# Patient Record
Sex: Male | Born: 1985 | Race: White | Hispanic: No | Marital: Single | Smoking: Never smoker
Health system: Southern US, Community
[De-identification: ages and names within clinical notes are randomized; demographics above are authoritative.]

## PROBLEM LIST (undated history)

## (undated) DIAGNOSIS — F32A Depression, unspecified: Secondary | ICD-10-CM

## (undated) DIAGNOSIS — F319 Bipolar disorder, unspecified: Secondary | ICD-10-CM

## (undated) DIAGNOSIS — F419 Anxiety disorder, unspecified: Secondary | ICD-10-CM

## (undated) DIAGNOSIS — K760 Fatty (change of) liver, not elsewhere classified: Secondary | ICD-10-CM

## (undated) DIAGNOSIS — F329 Major depressive disorder, single episode, unspecified: Secondary | ICD-10-CM

## (undated) DIAGNOSIS — F209 Schizophrenia, unspecified: Secondary | ICD-10-CM

---

## 2013-10-18 ENCOUNTER — Emergency Department (HOSPITAL_COMMUNITY)
Admission: EM | Admit: 2013-10-18 | Discharge: 2013-10-19 | Disposition: A | Discharge status: Home / Self Care | Payer: Medicare Other | Attending: Emergency Medicine | Admitting: Emergency Medicine

## 2013-10-18 ENCOUNTER — Encounter (HOSPITAL_COMMUNITY): Payer: Self-pay | Admitting: Emergency Medicine

## 2013-10-18 DIAGNOSIS — Z8719 Personal history of other diseases of the digestive system: Secondary | ICD-10-CM | POA: Insufficient documentation

## 2013-10-18 DIAGNOSIS — F329 Major depressive disorder, single episode, unspecified: Secondary | ICD-10-CM | POA: Insufficient documentation

## 2013-10-18 DIAGNOSIS — F3289 Other specified depressive episodes: Secondary | ICD-10-CM | POA: Insufficient documentation

## 2013-10-18 DIAGNOSIS — Z046 Encounter for general psychiatric examination, requested by authority: Secondary | ICD-10-CM | POA: Insufficient documentation

## 2013-10-18 DIAGNOSIS — R45851 Suicidal ideations: Secondary | ICD-10-CM | POA: Insufficient documentation

## 2013-10-18 DIAGNOSIS — F4325 Adjustment disorder with mixed disturbance of emotions and conduct: Secondary | ICD-10-CM | POA: Diagnosis present

## 2013-10-18 HISTORY — DX: Schizophrenia, unspecified: F20.9

## 2013-10-18 HISTORY — DX: Fatty (change of) liver, not elsewhere classified: K76.0

## 2013-10-18 LAB — CBC
HCT: 41.6 % (ref 39.0–52.0)
Hemoglobin: 14.6 g/dL (ref 13.0–17.0)
MCH: 31.6 pg (ref 26.0–34.0)
MCHC: 35.1 g/dL (ref 30.0–36.0)
MCV: 90 fL (ref 78.0–100.0)
Platelets: 291 10*3/uL (ref 150–400)
RBC: 4.62 MIL/uL (ref 4.22–5.81)
RDW: 12.6 % (ref 11.5–15.5)
WBC: 10.2 10*3/uL (ref 4.0–10.5)

## 2013-10-18 LAB — COMPREHENSIVE METABOLIC PANEL
ALT: 72 U/L — ABNORMAL HIGH (ref 0–53)
ANION GAP: 16 — AB (ref 5–15)
AST: 39 U/L — ABNORMAL HIGH (ref 0–37)
Albumin: 4.2 g/dL (ref 3.5–5.2)
Alkaline Phosphatase: 83 U/L (ref 39–117)
BILIRUBIN TOTAL: 0.7 mg/dL (ref 0.3–1.2)
BUN: 9 mg/dL (ref 6–23)
CHLORIDE: 103 meq/L (ref 96–112)
CO2: 24 meq/L (ref 19–32)
Calcium: 9.9 mg/dL (ref 8.4–10.5)
Creatinine, Ser: 0.7 mg/dL (ref 0.50–1.35)
GFR calc Af Amer: >90 mL/min (ref >90)
Glucose, Bld: 102 mg/dL — ABNORMAL HIGH (ref 70–99)
Potassium: 3.3 mEq/L — ABNORMAL LOW (ref 3.7–5.3)
Sodium: 143 mEq/L (ref 137–147)
Total Protein: 8.4 g/dL — ABNORMAL HIGH (ref 6.0–8.3)

## 2013-10-18 LAB — ACETAMINOPHEN LEVEL: Acetaminophen (Tylenol), Serum: <15 ug/mL (ref 10–30)

## 2013-10-18 LAB — SALICYLATE LEVEL: Salicylate Lvl: <2 mg/dL — ABNORMAL LOW (ref 2.8–20.0)

## 2013-10-18 LAB — VALPROIC ACID LEVEL: VALPROIC ACID LVL: 24.1 ug/mL — AB (ref 50.0–100.0)

## 2013-10-18 LAB — ETHANOL: Alcohol, Ethyl (B): <11 mg/dL (ref 0–11)

## 2013-10-18 MED ORDER — ACETAMINOPHEN 325 MG PO TABS: 650.0000 mg | ORAL_TABLET | ORAL | Status: DC | PRN

## 2013-10-18 MED ORDER — ZOLPIDEM TARTRATE 5 MG PO TABS
5.0000 mg | ORAL_TABLET | Freq: Every evening | ORAL | Status: DC | PRN
  Administered 2013-10-18: 5 mg via ORAL
  Filled 2013-10-18: qty 1

## 2013-10-18 MED ORDER — SERTRALINE HCL 50 MG PO TABS
50.0000 mg | ORAL_TABLET | Freq: Two times a day (BID) | ORAL | Status: DC
  Administered 2013-10-18 – 2013-10-19 (×2): 50 mg via ORAL
  Filled 2013-10-18 (×2): qty 1

## 2013-10-18 MED ORDER — HYDROXYZINE HCL 25 MG PO TABS: 25.0000 mg | ORAL_TABLET | Freq: Every day | ORAL | Status: DC

## 2013-10-18 MED ORDER — DIVALPROEX SODIUM ER 500 MG PO TB24
750.0000 mg | ORAL_TABLET | Freq: Every day | ORAL | Status: DC
  Administered 2013-10-18: 750 mg via ORAL
  Filled 2013-10-18 (×2): qty 1

## 2013-10-18 MED ORDER — OMEGA-3-ACID ETHYL ESTERS 1 G PO CAPS
1.0000 g | ORAL_CAPSULE | Freq: Two times a day (BID) | ORAL | Status: DC
  Administered 2013-10-18 – 2013-10-19 (×2): 1 g via ORAL
  Filled 2013-10-18 (×3): qty 1

## 2013-10-18 MED ORDER — ONDANSETRON HCL 4 MG PO TABS: 4.0000 mg | ORAL_TABLET | Freq: Three times a day (TID) | ORAL | Status: DC | PRN

## 2013-10-18 MED ORDER — POTASSIUM CHLORIDE CRYS ER 20 MEQ PO TBCR
20.0000 meq | EXTENDED_RELEASE_TABLET | Freq: Once | ORAL | Status: AC
  Administered 2013-10-18: 20 meq via ORAL
  Filled 2013-10-18: qty 1

## 2013-10-18 MED ORDER — LURASIDONE HCL 80 MG PO TABS
140.0000 mg | ORAL_TABLET | Freq: Every day | ORAL | Status: DC
  Administered 2013-10-19: 140 mg via ORAL
  Filled 2013-10-18 (×2): qty 2

## 2013-10-18 MED ORDER — PANTOPRAZOLE SODIUM 40 MG PO TBEC
40.0000 mg | DELAYED_RELEASE_TABLET | Freq: Every day | ORAL | Status: DC
  Administered 2013-10-18 – 2013-10-19 (×2): 40 mg via ORAL
  Filled 2013-10-18 (×2): qty 1

## 2013-10-18 MED ORDER — OLANZAPINE 10 MG PO TABS
20.0000 mg | ORAL_TABLET | Freq: Every day | ORAL | Status: DC
  Administered 2013-10-18: 20 mg via ORAL
  Filled 2013-10-18: qty 2

## 2013-10-18 MED ORDER — ALUM & MAG HYDROXIDE-SIMETH 200-200-20 MG/5ML PO SUSP: 30.0000 mL | ORAL | Status: DC | PRN

## 2013-10-18 MED ORDER — IBUPROFEN 200 MG PO TABS: 600.0000 mg | ORAL_TABLET | Freq: Three times a day (TID) | ORAL | Status: DC | PRN

## 2013-10-18 NOTE — ED Notes (Signed)
Pt BIB [facility] because of "anger issues".  Pt states that he lives in a group home and is depressed and has thoughts of hurting himself.  Pt denies any specific plan and denies HI.  Pt is voluntary and cooperative.
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
## 2013-10-19 ENCOUNTER — Encounter (HOSPITAL_COMMUNITY): Payer: Self-pay | Admitting: Psychiatry

## 2013-10-19 DIAGNOSIS — F4325 Adjustment disorder with mixed disturbance of emotions and conduct: Secondary | ICD-10-CM | POA: Diagnosis present

## 2013-10-19 LAB — RAPID URINE DRUG SCREEN, HOSP PERFORMED
Amphetamines: NOT DETECTED
BARBITURATES: NOT DETECTED
Benzodiazepines: NOT DETECTED
Cocaine: NOT DETECTED
Opiates: NOT DETECTED
Tetrahydrocannabinol: NOT DETECTED

## 2013-10-19 MED ORDER — LISDEXAMFETAMINE DIMESYLATE 20 MG PO CAPS
20.0000 mg | ORAL_CAPSULE | Freq: Every day | ORAL | Status: DC
  Administered 2013-10-19: 20 mg via ORAL
  Filled 2013-10-19: qty 1

## 2013-10-19 MED ORDER — DOCUSATE SODIUM 100 MG PO CAPS: 100.0000 mg | ORAL_CAPSULE | Freq: Two times a day (BID) | ORAL | Status: DC

## 2013-10-19 MED ORDER — DIVALPROEX SODIUM ER 250 MG PO TB24: 750.0000 mg | ORAL_TABLET | Freq: Every day | ORAL | Status: DC

## 2013-10-19 MED ORDER — OMEPRAZOLE 20 MG PO CPDR: 20.0000 mg | DELAYED_RELEASE_CAPSULE | ORAL | Status: DC

## 2013-10-19 MED ORDER — LISDEXAMFETAMINE DIMESYLATE 20 MG PO CAPS: 20.0000 mg | ORAL_CAPSULE | Freq: Every day | ORAL | Status: DC

## 2013-10-19 MED ORDER — SERTRALINE HCL 50 MG PO TABS: 50.0000 mg | ORAL_TABLET | Freq: Two times a day (BID) | ORAL | Status: DC

## 2013-10-19 MED ORDER — OLANZAPINE 20 MG PO TABS: 20.0000 mg | ORAL_TABLET | Freq: Every day | ORAL | Status: DC

## 2013-10-19 MED ORDER — LURASIDONE HCL 20 MG PO TABS: 140.0000 mg | ORAL_TABLET | Freq: Every day | ORAL | Status: DC

## 2013-10-19 MED ORDER — HYDROXYZINE HCL 25 MG PO TABS: 25.0000 mg | ORAL_TABLET | Freq: Every day | ORAL | Status: DC

## 2013-10-19 MED ORDER — OMEGA-3-ACID ETHYL ESTERS 1 G PO CAPS: 1.0000 g | ORAL_CAPSULE | Freq: Two times a day (BID) | ORAL | Status: DC
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
## 2013-10-19 NOTE — ED Provider Notes (Signed)
Medical screening examination/treatment/procedure(s) were performed by non-physician practitioner and as supervising physician I was immediately available for consultation/collaboration.   EKG Interpretation None        Gerhard Munchobert Elza Sortor, MD 10/19/13 [id number]
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
## 2013-10-19 NOTE — BH Assessment (Signed)
B[facility] Assessment Progress Note      Pt has been psychiatrically cleared for discharge by Dr Kumar.  Notified [name], group home director [phone], who will pick the pt up today.  

## 2013-10-19 NOTE — Progress Notes (Signed)
  CARE MANAGEMENT ED NOTE 10/19/2013  Patient:  [name] [name]   Account Number:  [id number]  Date Initiated:  10/19/2013  Documentation initiated by:  [name] [name],[name] [name]  Subjective/Objective Assessment:   28 [facility] who [place] c/o himself Scientist, forensiceports [facility] new psychologist changed meds (Vyvanse+) [name]     Subjective/Objective Assessment Detail:   DX Major Depressive Disorder, recurrent, severe. PMH fatty liver, schizophrenia  Axis II: Mental retardation, severity unknown No pcp listed Pt states he does not know the name of the pcp [place] Gave permission for CM to ask Group home staff  [facility] group home director [phone], [name] that the pcp is Dr [name] [name]     Action/Plan:   ED CM spoke with pt and [name] about pcp services EPIC updated   Action/Plan Detail:   Anticipated DC Date:  10/19/2013     Status Recommendation to Physician:   Result of Recommendation:    Other ED Services  Consult Working Plan    DC Planning Services  Other  PCP issues  Outpatient Services - Pt will follow up    Choice offered to / List presented to:            Status of service:  Completed, signed off  ED Comments:   ED Comments Detail:

## 2013-10-19 NOTE — ED Notes (Signed)
Patient discharged to group home.  All belongings returned.  Patient was able to verbalize a plan to talk issues out with a worker that he trusts when he is feeling upset.  He stated that sometimes he has racing thoughts, especially at night.  He was started back on Vyvance and was given the first dose.  All care discussed with group home worker.  Prescriptions and paperwork handed to her.  Patient left the unit ambulatory.

## 2013-10-19 NOTE — B[facility] Suicide Risk Assessment (Signed)
Suicide Risk Assessment  Discharge Assessment     Demographic Factors:  Male and Caucasian  Total Time spent with patient: 20 minutes  Psychiatric Specialty Exam:     Blood pressure 101/55, pulse 88, temperature 98 F (36.7 C), temperature source Oral, resp. rate 16, SpO2 100.00%.There is no height or weight on file to calculate BMI.  General Appearance: Casual  Eye Contact::  Good  Speech:  Normal Rate  Volume:  Normal  Mood:  Euthymic  Affect:  Congruent  Thought Process:  Coherent  Orientation:  Full (Time, Place, and Person)  Thought Content:  WDL  Suicidal Thoughts:  No  Homicidal Thoughts:  No  Memory:  Immediate;   Good Recent;   Good Remote;   Good  Judgement:  Fair  Insight:  Fair  Psychomotor Activity:  Normal  Concentration:  Good  Recall:  Good  Fund of Knowledge:Fair  Language: Good  Akathisia:  No  Handed:  Right  AIMS (if indicated):     Assets:  Health and safety inspectorinancial Resources/Insurance Housing Leisure Time Physical Health Resilience Social Support Transportation  Sleep:      Musculoskeletal: Strength & Muscle Tone: within normal limits Gait & Station: normal Patient leans: N/A  Mental Status Per Nursing Assessment::   On Admission:   Upset with group home  Current Mental Status by Physician: NA  Loss Factors: NA  Historical Factors: NA  Risk Reduction Factors:   Sense of responsibility to family, Living with another person, especially a relative, Positive social support and Positive therapeutic relationship  Continued Clinical Symptoms:  None  Cognitive Features That Contribute To Risk:  None  Suicide Risk:  Minimal: No identifiable suicidal ideation.  Patients presenting with no risk factors but with morbid ruminations; may be classified as minimal risk based on the severity of the depressive symptoms  Discharge Diagnoses:   AXIS I:  Adjustment Disorder with Mixed Disturbance of Emotions and Conduct AXIS II:  Deferred AXIS III:    Past Medical History  Diagnosis Date  . Fatty liver   . Schizophrenia    AXIS IV:  other psychosocial or environmental problems AXIS V:  61-70 mild symptoms  Plan Of Care/Follow-up recommendations:  Activity:  as tolerated Diet:  low-sodium heart healthy diet  Is patient on multiple antipsychotic therapies at discharge:  No   Has Patient had three or more failed trials of antipsychotic monotherapy by history:  No  Recommended Plan for Multiple Antipsychotic Therapies: NA    Deniece Rankin, PMH-NP 10/19/2013, 9:48 AM

## 2013-11-23 ENCOUNTER — Emergency Department (HOSPITAL_COMMUNITY)
Admission: EM | Admit: 2013-11-23 | Discharge: 2013-11-24 | Disposition: A | Discharge status: Home / Self Care | Payer: Medicare Other | Source: Home / Self Care | Attending: Emergency Medicine | Admitting: Emergency Medicine

## 2013-11-23 ENCOUNTER — Encounter (HOSPITAL_COMMUNITY): Payer: Self-pay | Admitting: Emergency Medicine

## 2013-11-23 DIAGNOSIS — Z8719 Personal history of other diseases of the digestive system: Secondary | ICD-10-CM

## 2013-11-23 DIAGNOSIS — R197 Diarrhea, unspecified: Secondary | ICD-10-CM | POA: Insufficient documentation

## 2013-11-23 DIAGNOSIS — Z79899 Other long term (current) drug therapy: Secondary | ICD-10-CM | POA: Insufficient documentation

## 2013-11-23 DIAGNOSIS — F209 Schizophrenia, unspecified: Secondary | ICD-10-CM | POA: Insufficient documentation

## 2013-11-23 DIAGNOSIS — E876 Hypokalemia: Secondary | ICD-10-CM | POA: Insufficient documentation

## 2013-11-23 DIAGNOSIS — F458 Other somatoform disorders: Secondary | ICD-10-CM | POA: Insufficient documentation

## 2013-11-23 DIAGNOSIS — R112 Nausea with vomiting, unspecified: Secondary | ICD-10-CM | POA: Insufficient documentation

## 2013-11-23 DIAGNOSIS — R1110 Vomiting, unspecified: Secondary | ICD-10-CM | POA: Insufficient documentation

## 2013-11-23 LAB — CBC WITH DIFFERENTIAL/PLATELET
BASOS ABS: 0 10*3/uL (ref 0.0–0.1)
Basophils Relative: 1 % (ref 0–1)
EOS ABS: 0.1 10*3/uL (ref 0.0–0.7)
Eosinophils Relative: 1 % (ref 0–5)
HCT: 41.8 % (ref 39.0–52.0)
Hemoglobin: 15.1 g/dL (ref 13.0–17.0)
LYMPHS PCT: 37 % (ref 12–46)
Lymphs Abs: 2.3 10*3/uL (ref 0.7–4.0)
MCH: 31.8 pg (ref 26.0–34.0)
MCHC: 36.1 g/dL — ABNORMAL HIGH (ref 30.0–36.0)
MCV: 88 fL (ref 78.0–100.0)
Monocytes Absolute: 0.8 10*3/uL (ref 0.1–1.0)
Monocytes Relative: 12 % (ref 3–12)
NEUTROS PCT: 49 % (ref 43–77)
Neutro Abs: 3.2 10*3/uL (ref 1.7–7.7)
PLATELETS: 308 10*3/uL (ref 150–400)
RBC: 4.75 MIL/uL (ref 4.22–5.81)
RDW: 12.7 % (ref 11.5–15.5)
WBC: 6.3 10*3/uL (ref 4.0–10.5)

## 2013-11-23 LAB — COMPREHENSIVE METABOLIC PANEL
ALT: 68 U/L — AB (ref 0–53)
AST: 34 U/L (ref 0–37)
Albumin: 4.2 g/dL (ref 3.5–5.2)
Alkaline Phosphatase: 72 U/L (ref 39–117)
Anion gap: 17 — ABNORMAL HIGH (ref 5–15)
BUN: <3 mg/dL — ABNORMAL LOW (ref 6–23)
CO2: 24 mEq/L (ref 19–32)
Calcium: 9.7 mg/dL (ref 8.4–10.5)
Chloride: 100 mEq/L (ref 96–112)
Creatinine, Ser: 0.5 mg/dL (ref 0.50–1.35)
GFR calc Af Amer: >90 mL/min (ref >90)
GFR calc non Af Amer: >90 mL/min (ref >90)
Glucose, Bld: 103 mg/dL — ABNORMAL HIGH (ref 70–99)
Potassium: 3.4 mEq/L — ABNORMAL LOW (ref 3.7–5.3)
SODIUM: 141 meq/L (ref 137–147)
TOTAL PROTEIN: 7.8 g/dL (ref 6.0–8.3)
Total Bilirubin: 0.7 mg/dL (ref 0.3–1.2)

## 2013-11-23 LAB — URINALYSIS, ROUTINE W REFLEX MICROSCOPIC
Bilirubin Urine: NEGATIVE
GLUCOSE, UA: NEGATIVE mg/dL
HGB URINE DIPSTICK: NEGATIVE
Ketones, ur: NEGATIVE mg/dL
Leukocytes, UA: NEGATIVE
Nitrite: NEGATIVE
PH: 7 (ref 5.0–8.0)
Protein, ur: NEGATIVE mg/dL
SPECIFIC GRAVITY, URINE: 1.004 — AB (ref 1.005–1.030)
Urobilinogen, UA: 1 mg/dL (ref 0.0–1.0)

## 2013-11-23 LAB — TSH: TSH: 2.93 u[IU]/mL (ref 0.350–4.500)

## 2013-11-23 LAB — VALPROIC ACID LEVEL: VALPROIC ACID LVL: 34.1 ug/mL — AB (ref 50.0–100.0)

## 2013-11-23 NOTE — ED Notes (Addendum)
Pt sent here from [facility]  Pt is under IVC and has Product/process development scientist with him  Pt has been vomiting today and has had diarrhea x 1 month  He resides in a group home and was seen at [facility] approximately 2 to 3 weeks ago  Reported vomiting and diarrhea then, group home advised to follow up with MD or ED and never did  Pt continues to have same sxs  Reports 22 lb weight loss in 1 month and has poor appetite  Sent here for medical clearance and if ok may go back to [facility] requesting several labs including stool culture and testing for c-diff

## 2013-11-24 ENCOUNTER — Encounter (HOSPITAL_COMMUNITY): Payer: Self-pay | Admitting: Emergency Medicine

## 2013-11-24 ENCOUNTER — Emergency Department (HOSPITAL_COMMUNITY)
Admission: EM | Admit: 2013-11-24 | Discharge: 2013-11-27 | Disposition: A | Discharge status: Home / Self Care | Payer: Medicare Other | Attending: Dermatology | Admitting: Dermatology

## 2013-11-24 ENCOUNTER — Emergency Department (HOSPITAL_COMMUNITY): Payer: Medicare Other

## 2013-11-24 DIAGNOSIS — F4325 Adjustment disorder with mixed disturbance of emotions and conduct: Secondary | ICD-10-CM | POA: Diagnosis present

## 2013-11-24 DIAGNOSIS — F458 Other somatoform disorders: Secondary | ICD-10-CM

## 2013-11-24 DIAGNOSIS — F209 Schizophrenia, unspecified: Secondary | ICD-10-CM

## 2013-11-24 DIAGNOSIS — R4689 Other symptoms and signs involving appearance and behavior: Secondary | ICD-10-CM

## 2013-11-24 LAB — COMPREHENSIVE METABOLIC PANEL
ALT: 73 U/L — ABNORMAL HIGH (ref 0–53)
AST: 39 U/L — AB (ref 0–37)
Albumin: 4.1 g/dL (ref 3.5–5.2)
Alkaline Phosphatase: 66 U/L (ref 39–117)
Anion gap: 14 (ref 5–15)
BILIRUBIN TOTAL: 0.6 mg/dL (ref 0.3–1.2)
BUN: 5 mg/dL — ABNORMAL LOW (ref 6–23)
CO2: 25 mEq/L (ref 19–32)
CREATININE: 0.58 mg/dL (ref 0.50–1.35)
Calcium: 9.9 mg/dL (ref 8.4–10.5)
Chloride: 102 mEq/L (ref 96–112)
GFR calc Af Amer: >90 mL/min (ref >90)
GFR calc non Af Amer: >90 mL/min (ref >90)
Glucose, Bld: 104 mg/dL — ABNORMAL HIGH (ref 70–99)
POTASSIUM: 3.8 meq/L (ref 3.7–5.3)
Sodium: 141 mEq/L (ref 137–147)
Total Protein: 7.6 g/dL (ref 6.0–8.3)

## 2013-11-24 LAB — URINALYSIS, ROUTINE W REFLEX MICROSCOPIC
Glucose, UA: NEGATIVE mg/dL
Hgb urine dipstick: NEGATIVE
KETONES UR: 15 mg/dL — AB
Leukocytes, UA: NEGATIVE
Nitrite: NEGATIVE
Protein, ur: NEGATIVE mg/dL
Specific Gravity, Urine: 1.025 (ref 1.005–1.030)
Urobilinogen, UA: 2 mg/dL — ABNORMAL HIGH (ref 0.0–1.0)
pH: 7 (ref 5.0–8.0)

## 2013-11-24 MED ORDER — LURASIDONE HCL 40 MG PO TABS
40.0000 mg | ORAL_TABLET | Freq: Every day | ORAL | Status: DC
  Administered 2013-11-24 – 2013-11-26 (×3): 40 mg via ORAL
  Filled 2013-11-24 (×5): qty 1

## 2013-11-24 MED ORDER — HYDROXYZINE HCL 25 MG PO TABS
25.0000 mg | ORAL_TABLET | Freq: Every day | ORAL | Status: DC
  Administered 2013-11-24 – 2013-11-26 (×3): 25 mg via ORAL
  Filled 2013-11-24 (×3): qty 1

## 2013-11-24 MED ORDER — OLANZAPINE 10 MG PO TABS
20.0000 mg | ORAL_TABLET | Freq: Every day | ORAL | Status: DC
  Administered 2013-11-24 – 2013-11-26 (×3): 20 mg via ORAL
  Filled 2013-11-24 (×3): qty 2

## 2013-11-24 MED ORDER — SERTRALINE HCL 50 MG PO TABS
50.0000 mg | ORAL_TABLET | Freq: Every day | ORAL | Status: DC
  Filled 2013-11-24: qty 1

## 2013-11-24 MED ORDER — ONDANSETRON 4 MG PO TBDP: ORAL_TABLET | ORAL | Status: DC

## 2013-11-24 MED ORDER — POTASSIUM CHLORIDE CRYS ER 20 MEQ PO TBCR
40.0000 meq | EXTENDED_RELEASE_TABLET | Freq: Once | ORAL | Status: AC
Start: 2013-11-24 — End: 2013-11-24
  Administered 2013-11-24: 40 meq via ORAL
  Filled 2013-11-24: qty 2

## 2013-11-24 MED ORDER — DIVALPROEX SODIUM ER 500 MG PO TB24
500.0000 mg | ORAL_TABLET | Freq: Every day | ORAL | Status: DC
  Administered 2013-11-24 – 2013-11-27 (×4): 500 mg via ORAL
  Filled 2013-11-24 (×4): qty 1

## 2013-11-24 MED ORDER — METOCLOPRAMIDE HCL 5 MG/ML IJ SOLN
10.0000 mg | Freq: Once | INTRAMUSCULAR | Status: AC
  Administered 2013-11-24: 10 mg via INTRAMUSCULAR
  Filled 2013-11-24: qty 2

## 2013-11-24 MED ORDER — PANTOPRAZOLE SODIUM 40 MG PO TBEC
40.0000 mg | DELAYED_RELEASE_TABLET | Freq: Every day | ORAL | Status: DC
  Administered 2013-11-24 – 2013-11-27 (×4): 40 mg via ORAL
  Filled 2013-11-24 (×4): qty 1

## 2013-11-24 NOTE — ED Notes (Signed)
Receiving nurse in [facility] to call back for report.

## 2013-11-24 NOTE — Discharge Instructions (Signed)
You were unable to have a bowel movement in the ER today and thus we were unable to test it for infection. You were given a sample container to take home, use it to collect a sample with your next bowel movement and [facility] can send it for culture and studies to rule out infection.   Diarrhea Diarrhea is frequent loose and watery bowel movements. It can cause you to feel weak and dehydrated. Dehydration can cause you to become tired and thirsty, have a dry mouth, and have decreased urination that often is dark yellow. Diarrhea is a sign of another problem, most often an infection that will not last long. In most cases, diarrhea typically lasts 2-3 days. However, it can last longer if it is a sign of something more serious. It is important to treat your diarrhea as directed by your caregiver to lessen or prevent future episodes of diarrhea. CAUSES  Some common causes include:  Gastrointestinal infections caused by viruses, bacteria, or parasites.  Food poisoning or food allergies.  Certain medicines, such as antibiotics, chemotherapy, and laxatives.  Artificial sweeteners and fructose.  Digestive disorders. HOME CARE INSTRUCTIONS  Ensure adequate fluid intake (hydration): Have 1 cup (8 oz) of fluid for each diarrhea episode. Avoid fluids that contain simple sugars or sports drinks, fruit juices, whole milk products, and sodas. Your urine should be clear or pale yellow if you are drinking enough fluids. Hydrate with an oral rehydration solution that you can purchase at pharmacies, retail stores, and online. You can prepare an oral rehydration solution at home by mixing the following ingredients together:   - tsp table salt.   tsp baking soda.   tsp salt substitute containing potassium chloride.  1  tablespoons sugar.  1 L (34 oz) of water.  Certain foods and beverages may increase the speed at which food moves through the gastrointestinal (GI) tract. These foods and beverages should be  avoided and include:  Caffeinated and alcoholic beverages.  High-fiber foods, such as raw fruits and vegetables, nuts, seeds, and whole grain breads and cereals.  Foods and beverages sweetened with sugar alcohols, such as xylitol, sorbitol, and mannitol.  Some foods may be well tolerated and may help thicken stool including:  Starchy foods, such as rice, toast, pasta, low-sugar cereal, oatmeal, grits, baked potatoes, crackers, and bagels.  Bananas.  Applesauce.  Add probiotic-rich foods to help increase healthy bacteria in the GI tract, such as yogurt and fermented milk products.  Wash your hands well after each diarrhea episode.  Only take over-the-counter or prescription medicines as directed by your caregiver.  Take a warm bath to relieve any burning or pain from frequent diarrhea episodes. SEEK IMMEDIATE MEDICAL CARE IF:   You are unable to keep fluids down.  You have persistent vomiting.  You have blood in your stool, or your stools are black and tarry.  You do not urinate in 6-8 hours, or there is only a small amount of very dark urine.  You have abdominal pain that increases or localizes.  You have weakness, dizziness, confusion, or light-headedness.  You have a severe headache.  Your diarrhea gets worse or does not get better.  You have a fever or persistent symptoms for more than 2-3 days.  You have a fever and your symptoms suddenly get worse. MAKE SURE YOU:   Understand these instructions.  Will watch your condition.  Will get help right away if you are not doing well or get worse. Document Released: 03/02/2002 Document  Revised: 07/27/2013 Document Reviewed: 11/18/2011 [facility] Patient Information 2015 [place], [place]. This information is not intended to replace advice given to you by your health care provider. Make sure you discuss any questions you have with your health care provider.    Hypokalemia Hypokalemia means that the amount of potassium  in the blood is lower than normal.Potassium is a chemical, called an electrolyte, that helps regulate the amount of fluid in the body. It also stimulates muscle contraction and helps nerves function properly.Most of the body's potassium is inside of cells, and only a very small amount is in the blood. Because the amount in the blood is so small, minor changes can be life-threatening. CAUSES  Antibiotics.  Diarrhea or vomiting.  Using laxatives too much, which can cause diarrhea.  Chronic kidney disease.  Water pills (diuretics).  Eating disorders (bulimia).  Low magnesium level.  Sweating a lot. SIGNS AND SYMPTOMS  Weakness.  Constipation.  Fatigue.  Muscle cramps.  Mental confusion.  Skipped heartbeats or irregular heartbeat (palpitations).  Tingling or numbness. DIAGNOSIS  Your health care provider can diagnose hypokalemia with blood tests. In addition to checking your potassium level, your health care provider may also check other lab tests. TREATMENT Hypokalemia can be treated with potassium supplements taken by mouth or adjustments in your current medicines. If your potassium level is very low, you may need to get potassium through a vein (IV) and be monitored in the hospital. A diet high in potassium is also helpful. Foods high in potassium are:  Nuts, such as peanuts and pistachios.  Seeds, such as sunflower seeds and pumpkin seeds.  Peas, lentils, and lima beans.  Whole grain and bran cereals and breads.  Fresh fruit and vegetables, such as apricots, avocado, bananas, cantaloupe, kiwi, oranges, tomatoes, asparagus, and potatoes.  Orange and tomato juices.  Red meats.  Fruit yogurt. HOME CARE INSTRUCTIONS  Take all medicines as prescribed by your health care provider.  Maintain a healthy diet by including nutritious food, such as fruits, vegetables, nuts, whole grains, and lean meats.  If you are taking a laxative, be sure to follow the directions  on the label. SEEK MEDICAL CARE IF:  Your weakness gets worse.  You feel your heart pounding or racing.  You are vomiting or having diarrhea.  You are diabetic and having trouble keeping your blood glucose in the normal range. SEEK IMMEDIATE MEDICAL CARE IF:  You have chest pain, shortness of breath, or dizziness.  You are vomiting or having diarrhea for more than 2 days.  You faint. MAKE SURE YOU:   Understand these instructions.  Will watch your condition.  Will get help right away if you are not doing well or get worse. Document Released: 03/12/2005 Document Revised: 12/31/2012 Document Reviewed: 09/12/2012 [facility] Patient Information 2015 [place], [place]. This information is not intended to replace advice given to you by your health care provider. Make sure you discuss any questions you have with your health care provider.

## 2013-11-24 NOTE — ED Notes (Signed)
Patient transported to X-ray 

## 2013-11-24 NOTE — ED Notes (Signed)
EDP notified of conversation with Marcelle Smiling at [facility]. Natasha informed Clinical research associate that [facility] could not legally accept patient back at their facility because patient was vomiting. Marcelle Smiling informed that the patient was cleared medically by Dr. Denton Lank. Marcelle Smiling stated that the patient had been discharged by their physician and could not accept him back.

## 2013-11-24 NOTE — ED Notes (Signed)
Patient belongings are in locker 45

## 2013-11-24 NOTE — ED Provider Notes (Addendum)
CSN: [id number]     Arrival date & time 11/24/13  1534 History   First MD Initiated Contact with Patient 11/24/13 1649     Chief Complaint  Patient presents with  . Emesis     (Consider location/radiation/quality/duration/timing/severity/associated sxs/prior Treatment) Patient is a 28 y.o. male presenting with vomiting. The history is provided by the patient.  Emesis Associated symptoms: no abdominal pain, no chills, no diarrhea, no headaches and no sore throat   pt w hx schizophrenia, ivc'd re psychosis, from [place], as pt w nv.  Pt reports 1-2 episodes non bloody, non bilious emesis. No abd pain or distension. Had normal bm today. No fever or chills. No dysuria. Normal appetite. unspec wt loss. Pt denies recent abx use or new med use. Was in ED w same yesterday.  Parent here, notes long hx same, denies specific medical dx, ?psychogenic vs physiologic. No prior abd surgery.    Past Medical History  Diagnosis Date  . Fatty liver   . Schizophrenia    History reviewed. No pertinent past surgical history. History reviewed. No pertinent family history. History  Substance Use Topics  . Smoking status: Never Smoker   . Smokeless tobacco: Never Used  . Alcohol Use: No    Review of Systems  Constitutional: Negative for fever and chills.  HENT: Negative for sore throat.   Eyes: Negative for redness.  Respiratory: Negative for cough and shortness of breath.   Cardiovascular: Negative for chest pain and leg swelling.  Gastrointestinal: Positive for vomiting. Negative for abdominal pain and diarrhea.  Genitourinary: Negative for flank pain.  Musculoskeletal: Negative for back pain and neck pain.  Skin: Negative for rash.  Neurological: Negative for syncope and headaches.  Hematological: Does not bruise/bleed easily.  Psychiatric/Behavioral: Negative for confusion.      Allergies  Review of patient's allergies indicates no known allergies.  Home Medications   Prior to Admission  medications   Medication Sig Start Date End Date Taking? Authorizing Provider  divalproex (DEPAKOTE ER) 500 MG 24 hr tablet Take 500 mg by mouth daily.   Yes Historical Provider, MD  docusate sodium (COLACE) 100 MG capsule Take 200 mg by mouth 2 (two) times daily.   Yes Historical Provider, MD  folic acid (FOLVITE) 1 MG tablet Take 1 mg by mouth daily.   Yes Historical Provider, MD  hydrOXYzine (ATARAX/VISTARIL) 25 MG tablet Take 1 tablet (25 mg total) by mouth at bedtime. 10/19/13  Yes Nanine Means, NP  lurasidone (LATUDA) 40 MG TABS tablet Take 40 mg by mouth at bedtime.   Yes Historical Provider, MD  OLANZapine (ZYPREXA) 20 MG tablet Take 1 tablet (20 mg total) by mouth at bedtime. 10/19/13  Yes Nanine Means, NP  omega-3 acid ethyl esters (LOVAZA) 1 G capsule Take 1 capsule (1 g total) by mouth 2 (two) times daily. 10/19/13  Yes Nanine Means, NP  omeprazole (PRILOSEC) 20 MG capsule Take 1 capsule (20 mg total) by mouth every morning. 10/19/13  Yes Nanine Means, NP  ondansetron (ZOFRAN-ODT) 4 MG disintegrating tablet Take 4 mg by mouth every 4 (four) hours as needed for nausea or vomiting. 11/24/13  Yes Audree Camel, MD  sertraline (ZOLOFT) 50 MG tablet Take 1 tablet (50 mg total) by mouth 2 (two) times daily. 10/19/13  Yes Nanine Means, NP   BP 123/71  Pulse 78  Temp(Src) 98.2 F (36.8 C) (Oral)  Resp 16  SpO2 95% Physical Exam  Nursing note and vitals reviewed. Constitutional: He is  oriented to person, place, and time. He appears well-developed and well-nourished. No distress.  HENT:  Head: Atraumatic.  Mouth/Throat: Oropharynx is clear and moist.  Eyes: Conjunctivae are normal. Pupils are equal, round, and reactive to light. No scleral icterus.  Neck: Neck supple. No tracheal deviation present.  No stiffness or rigidity  Cardiovascular: Normal rate, regular rhythm, normal heart sounds and intact distal pulses.   Pulmonary/Chest: Effort normal and breath sounds normal. No accessory  muscle usage. No respiratory distress.  Abdominal: Soft. Bowel sounds are normal. He exhibits no distension and no mass. There is no tenderness. There is no rebound and no guarding.  No hernia.   Genitourinary:  No cva tenderness  Musculoskeletal: Normal range of motion. He exhibits no edema and no tenderness.  Neurological: He is alert and oriented to person, place, and time.  Steady gait.   Skin: Skin is warm and dry. No rash noted. He is not diaphoretic.  Psychiatric: He has a normal mood and affect.    ED Course  Procedures (including critical care time) Labs Review  Results for orders placed during the hospital encounter of 11/24/13  COMPREHENSIVE METABOLIC PANEL      Result Value Ref Range   Sodium 141  137 - 147 mEq/L   Potassium 3.8  3.7 - 5.3 mEq/L   Chloride 102  96 - 112 mEq/L   CO2 25  19 - 32 mEq/L   Glucose, Bld 104 (*) 70 - 99 mg/dL   BUN 5 (*) 6 - 23 mg/dL   Creatinine, Ser 9.60  0.50 - 1.35 mg/dL   Calcium 9.9  8.4 - 45.4 mg/dL   Total Protein 7.6  6.0 - 8.3 g/dL   Albumin 4.1  3.5 - 5.2 g/dL   AST 39 (*) 0 - 37 U/L   ALT 73 (*) 0 - 53 U/L   Alkaline Phosphatase 66  39 - 117 U/L   Total Bilirubin 0.6  0.3 - 1.2 mg/dL   GFR calc non Af Amer >90  >90 mL/min   GFR calc Af Amer >90  >90 mL/min   Anion gap 14  5 - 15   Dg Abd 1 View  11/24/2013   CLINICAL DATA:  Abdominal pain and vomiting for one month with weight loss  EXAM: ABDOMEN - 1 VIEW  COMPARISON:  None.  FINDINGS: The bowel gas pattern is within the limits of normal. There are no abnormal soft tissue calcifications. There 2 phleboliths within the left aspect of the pelvis. The bony structures are unremarkable. The visualized portions of the lung bases are clear.  IMPRESSION: There is no acute intra-abdominal or pelvic abnormality demonstrated on this two-view series.   Electronically Signed   By: David  [place]   On: 11/24/2013 17:47     MDM  Po fluids/meal.  Reviewed nursing notes and prior charts for  additional history.   Albumin normal, bun normal, k normal. No indication wasting or malnourishment on exam. No emesis during ED stay. Completely benign abd exam. abd films neg ua from past day also w no signs dehydration. .  ?whether episodes emesis psychogenic.  Pt came from [name] where he has been w ivc papers awaiting further psych tx/placement.  Pt appears medically stable for return to [facility].   Will contact law enforcement/security for transport back to [facility], as pt has been there since Sunday and they have ivc'd pt.   [facility] contacted, they are refusing to accept pt back.  Psych team consulted for  evaluation and placement.  dispo per psych team.      Suzi Roots, MD 11/24/13 [phone]
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
## 2013-11-24 NOTE — Discharge Instructions (Signed)
Pt is medically clear for return to [facility] - return to [facility] via Nurse, learning disability, as Mr Buffone has been a patient there the past 2 days, and they have ivc'd him.
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
## 2013-11-24 NOTE — ED Notes (Signed)
Patient ambulated to restroom, unable to provide sample.

## 2013-11-24 NOTE — ED Provider Notes (Signed)
CSN: [id number]     Arrival date & time 11/23/13  2005 History   First MD Initiated Contact with Patient 11/23/13 2353     Chief Complaint  Patient presents with  . Diarrhea  . Emesis     (Consider location/radiation/quality/duration/timing/severity/associated sxs/prior Treatment) HPI 28 year old male presents with diarrhea for the past 2 months. He states that this seems to increase with increased eating. He states that after most meals he seems to have diarrhea afterwards. Has loose watery stools. No blood or mucus. No fevers. Patient has also been having intermittent nausea and vomiting after meals. He's not sure which meals bring this on. Denies abdominal pain. He states that he eats less he seems to have less stools. He is currently under IVC and his facility sent him to the ED to rule out C. difficile and get a stool culture.  Past Medical History  Diagnosis Date  . Fatty liver   . Schizophrenia    History reviewed. No pertinent past surgical history. History reviewed. No pertinent family history. History  Substance Use Topics  . Smoking status: Never Smoker   . Smokeless tobacco: Never Used  . Alcohol Use: No    Review of Systems  Constitutional: Negative for fever.  Gastrointestinal: Positive for nausea, vomiting and diarrhea. Negative for abdominal pain and blood in stool.  All other systems reviewed and are negative.     Allergies  Review of patient's allergies indicates no known allergies.  Home Medications   Prior to Admission medications   Medication Sig Start Date End Date Taking? Authorizing Provider  divalproex (DEPAKOTE ER) 500 MG 24 hr tablet Take 500 mg by mouth daily.   Yes Historical Provider, MD  docusate sodium (COLACE) 100 MG capsule Take 200 mg by mouth 2 (two) times daily.   Yes Historical Provider, MD  folic acid (FOLVITE) 1 MG tablet Take 1 mg by mouth daily.   Yes Historical Provider, MD  hydrOXYzine (ATARAX/VISTARIL) 25 MG tablet Take 1  tablet (25 mg total) by mouth at bedtime. 10/19/13  Yes Nanine Means, NP  lurasidone (LATUDA) 40 MG TABS tablet Take 40 mg by mouth at bedtime.   Yes Historical Provider, MD  OLANZapine (ZYPREXA) 20 MG tablet Take 1 tablet (20 mg total) by mouth at bedtime. 10/19/13  Yes Nanine Means, NP  omega-3 acid ethyl esters (LOVAZA) 1 G capsule Take 1 capsule (1 g total) by mouth 2 (two) times daily. 10/19/13  Yes Nanine Means, NP  omeprazole (PRILOSEC) 20 MG capsule Take 1 capsule (20 mg total) by mouth every morning. 10/19/13  Yes Nanine Means, NP  sertraline (ZOLOFT) 50 MG tablet Take 1 tablet (50 mg total) by mouth 2 (two) times daily. 10/19/13  Yes Nanine Means, NP   BP 120/64  Pulse 84  Temp(Src) 98.4 F (36.9 C) (Oral)  Resp 17  SpO2 100% Physical Exam  Nursing note and vitals reviewed. Constitutional: He is oriented to person, place, and time. He appears well-developed and well-nourished. No distress.  HENT:  Head: Normocephalic and atraumatic.  Right Ear: External ear normal.  Left Ear: External ear normal.  Nose: Nose normal.  Mouth/Throat: Oropharynx is clear and moist.  Eyes: Right eye exhibits no discharge. Left eye exhibits no discharge.  Neck: Neck supple.  Cardiovascular: Normal rate.   Pulmonary/Chest: Effort normal.  Abdominal: Soft. He exhibits no distension. There is no tenderness.  Musculoskeletal: He exhibits no edema.  Neurological: He is alert and oriented to person, place, and time.  Skin: Skin is warm and dry.    ED Course  Procedures (including critical care time) Labs Review Labs Reviewed  CBC WITH DIFFERENTIAL - Abnormal; Notable for the following:    MCHC 36.1 (*)    All other components within normal limits  COMPREHENSIVE METABOLIC PANEL - Abnormal; Notable for the following:    Potassium 3.4 (*)    Glucose, Bld 103 (*)    BUN <3 (*)    ALT 68 (*)    Anion gap 17 (*)    All other components within normal limits  URINALYSIS, ROUTINE W REFLEX MICROSCOPIC -  Abnormal; Notable for the following:    Specific Gravity, Urine 1.004 (*)    All other components within normal limits  VALPROIC ACID LEVEL - Abnormal; Notable for the following:    Valproic Acid Lvl 34.1 (*)    All other components within normal limits  STOOL CULTURE  CLOSTRIDIUM DIFFICILE BY PCR  TSH    Imaging Review No results found.   EKG Interpretation None      MDM   Final diagnoses:  Diarrhea  Non-intractable vomiting with nausea, vomiting of unspecified type  Hypokalemia    Patient does not have any risk factors for more severe diarrhea such as C. difficile. His facility is requesting C. difficile to be evaluated, however after over 4 hours patient is unable to produce a stool sample. Due to this will provide a stool sample container and he can have it checked as an outpatient through his facility. He otherwise not having any pain or current vomiting. He has very mild hypokalemia which was replaced in the ED. He is well appearing, well-hydrated, and stable for discharge.    Audree Camel, MD 11/24/13 [phone]

## 2013-11-25 ENCOUNTER — Encounter (HOSPITAL_COMMUNITY): Payer: Self-pay | Admitting: Registered Nurse

## 2013-11-25 DIAGNOSIS — F4324 Adjustment disorder with disturbance of conduct: Secondary | ICD-10-CM

## 2013-11-25 DIAGNOSIS — R4689 Other symptoms and signs involving appearance and behavior: Secondary | ICD-10-CM | POA: Diagnosis present

## 2013-11-25 DIAGNOSIS — F458 Other somatoform disorders: Secondary | ICD-10-CM | POA: Diagnosis not present
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
## 2013-11-25 NOTE — Progress Notes (Signed)
CSW completed the patient's FL2 and Dr. Fredderick Phenix signed.  CSW will pass on to day shift CSW to fax to guardian in the AM to [phone].     CSW attempted to find group home placement for the patient at the following facilities:  [facility]- someone put CSW on hold never returned to the phone [facility]- no answer [facility]- only has male beds [facility]- left a message [facility]- spoke with Maryruth Hancock said no availability [facility]- spoke with Maisie Fus no availability [facility]- spoke with Misty Stanley she requested an FL2 to be faxed to [phone] for review by the owner and someone will call back.    CSW will provide the guardian with this information in the morning for consent.     Maryelizabeth Rowan, MSW, [place], 11/25/2013 Evening Clinical Social Worker [phone]

## 2013-11-25 NOTE — ED Notes (Signed)
Patient has been resting comfortably in bed. Denies SI, HI, and AV hallucinations. Calm and cooperative. Will continue to monitor. Billy Coast, RN
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
## 2013-11-25 NOTE — Progress Notes (Signed)
Clinical Social Work  CSW received a return call from guardian reporting that she had spoken to group home who reported they did not want to take patient back. Guardian reports that group home had not given her a notice and usually a 2 week notice is required. Guardian reports she is working on finding another group home placement for patient but has not had any success at this time. Guardian is aware that patient cannot stay in ED waiting on placement and reports she will talk with group home about accepting patient back since no notice was given. Guardian reports she will call CSW back once she has further information.  [place], [place] (ED Coverage [phone])
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
## 2013-11-25 NOTE — Progress Notes (Signed)
CSW spoke with Arvid Right and she was unable to provide much background information on this patient because she received the case only 2 weeks ago.  She reports that the group home provider had not been very helpful with getting her the information she need the process of placement for the patient.  CSW asked Marylene Land about the patient's alleged IDD dx and she was unable to produce any information.    CSW spoke with [place] with [facility] [phone] to collect collateral information.  Marcelino Duster reports that the patient has a diagnosis of Schizophrenia and ADHD. She did not acknowledge a diagnosis of IDD or anything similar.  She reports that the patient's Car Coordinator is Lizbeth Bark [phone] which is a Land and not an in the IDD department.  She reports that Vesta Mixer is the patient's primary mental health provider.     Maryelizabeth Rowan, MSW, [place], 11/25/2013 Evening Clinical Social Worker [phone]

## 2013-11-25 NOTE — Progress Notes (Addendum)
3:11pm CSW received a call from Arvid Right DSS guardian requesting a FL2 for possible placement opportunities.  She reports that the patient has an appointment on 9/3@1pm  with a group home and they will come here.  She gave her contact numbers [phone]([id number]), [phone]([phone]), [phone]).    3:45pm CSW called to [facility] to get clarity of the patient still being under IVC but was discharged by their physicians while under their care.  CSW spoke with Delorse Limber, RN who stated that the patient was sent to [facility] because of n/v/d and they had to keep the IVC because of the [employer] and [employer].  Under her impression the patient was suppose to come here and be treated for mental health and psych placement although the initial chief complaint is medical and their facility was able to treat for psych.  CSW spoke with Jeanice Lim, Child psychotherapist as she informed this clinician that the patient was to go back to the group home once he was discharge to her understanding.  CSW informed Jeanice Lim that clear communication between both agencies is needed.  This case had a clear social work component and [facility] social social work department should have been called to be made aware of the complexity.  CSW and [place] agreed.    Maryelizabeth Rowan, MSW, [place], 11/25/2013 Evening Clinical Social Worker [phone]
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
## 2013-11-26 DIAGNOSIS — F458 Other somatoform disorders: Secondary | ICD-10-CM | POA: Diagnosis not present

## 2013-11-26 DIAGNOSIS — Z008 Encounter for other general examination: Secondary | ICD-10-CM

## 2013-11-26 NOTE — Progress Notes (Addendum)
Group home [facility]) representative came out to interview pt. GH unable to accept pt because he is not an innovations pt. CSW informed guardian of this. Guardian states she is working on another placement in [place]. CSW stated that placement could take days and that ED cannot keep placement during this time, and that pt's previous group home would need to take him back. Guardian confirms no charges were filed. Phillip Heal [phone]) is group home owner. CSW called 1x at 3pm and left message.   CSW received verbal consent from pt's guardian to fax pt out to various group homes in [place] area.  _______ CSW called [place], worker at [employer] to find out pt's Innovation's waiver status. Dolly's supervisor is Victorino Dike [phone]).   Per [place], he does not receive innovations or any care coordination. Steele Sizer thought that his care might have been transferred to [facility] as he goes to a day program in [place]. CSW called [employer], he is not set up in their system with any care coordiantor.  Mariann Laster,     ED CSW  phone: [phone] 2:56pm

## 2013-11-26 NOTE — Consult Note (Signed)
  Psychiatric Specialty Exam: Physical Exam  ROS  Blood pressure 110/53, pulse 80, temperature 97.8 F (36.6 C), temperature source Oral, resp. rate 18, SpO2 100.00%.There is no height or weight on file to calculate BMI.  General Appearance: Well Groomed  Patent attorney::  Good  Speech:  Clear and Coherent  Volume:  Normal  Mood:  Euthymic  Affect:  Appropriate  Thought Process:  Coherent and Logical  Orientation:  Full (Time, Place, and Person)  Thought Content:  Negative  Suicidal Thoughts:  No  Homicidal Thoughts:  No  Memory:  Immediate;   Good Recent;   Good Remote;   Good  Judgement:  Intact  Insight:  Shallow  Psychomotor Activity:  Normal  Concentration:  Good  Recall:  Good  Akathisia:  Negative  Handed:  Right  AIMS (if indicated):     Assets:  Communication Skills Physical Health Social Support  Sleep:   good  [name] [name] is ready for discharge but at this point he has nowhere to go.  He is calm, denying any suicidal thoughts and denying any threats to anybody else. DSS is coming to see him at 1 pm today.  He says he has stayed with his parents temporarily in between placements in the past.

## 2013-11-26 NOTE — Progress Notes (Addendum)
CSW received call from Herbert Seta. States she has not been in office today so she has not received fax. However, states she has been in communication with pt's family and him returning home until placement is found is not an option. States that she will be here with group home rep at 2pm. States she is also pursuing a group home in [place] and another group home in [place]. CSW stated that pt is ready for d/c, and that if d/c to parents was not an option, pt would need to go back to original group home until pt was another group home was found.   CSW met with pt to inform him and discuss upcoming meeting with guardian and group home rep. CSW provided supportive counseling.  [name],     ED CSW  phone: [phone] 1:12pm ________ CSW faxed pt's FL2 form at 10am to pt's guardian, Herbert Seta. CSW called [facility] and left message. Glenn's supervisor is Truett Perna [phone].  [name],     ED CSW  phone: [phone] 11:09am

## 2013-11-26 NOTE — Progress Notes (Signed)
CSW completed the PASARR [id number] and faxed information to [facility] home for review.     Maryelizabeth Rowan, MSW, [place], 11/26/2013 Evening Clinical Social Worker [phone]
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
## 2013-11-26 NOTE — Progress Notes (Deleted)
Pt accepted to [facility] 500-1 per Inetta Fermo, [facility] voluntarily.  CSW informed nursing staff of the updated disposition and provided the needed paperwork for transfer.    Maryelizabeth Rowan, MSW, [place], 11/26/2013 Evening Clinical Social Worker [phone]

## 2013-11-27 ENCOUNTER — Encounter (HOSPITAL_COMMUNITY): Payer: Self-pay | Admitting: Psychiatry

## 2013-11-27 DIAGNOSIS — F603 Borderline personality disorder: Secondary | ICD-10-CM

## 2013-11-27 DIAGNOSIS — F4325 Adjustment disorder with mixed disturbance of emotions and conduct: Secondary | ICD-10-CM

## 2013-11-27 DIAGNOSIS — F458 Other somatoform disorders: Secondary | ICD-10-CM | POA: Diagnosis not present

## 2013-11-27 MED ORDER — LORAZEPAM 1 MG PO TABS: 1.0000 mg | ORAL_TABLET | Freq: Four times a day (QID) | ORAL | Status: DC | PRN

## 2013-11-27 MED ORDER — QUETIAPINE FUMARATE 100 MG PO TABS: 100.0000 mg | ORAL_TABLET | Freq: Every day | ORAL | Status: DC

## 2013-11-27 MED ORDER — OMEPRAZOLE 20 MG PO CPDR: 20.0000 mg | DELAYED_RELEASE_CAPSULE | Freq: Every day | ORAL | Status: DC

## 2013-11-27 MED ORDER — QUETIAPINE FUMARATE 25 MG PO TABS
25.0000 mg | ORAL_TABLET | Freq: Three times a day (TID) | ORAL | Status: DC
  Administered 2013-11-27: 25 mg via ORAL
  Filled 2013-11-27: qty 1

## 2013-11-27 MED ORDER — LURASIDONE HCL 40 MG PO TABS: 40.0000 mg | ORAL_TABLET | Freq: Every day | ORAL | Status: DC

## 2013-11-27 MED ORDER — FOLIC ACID 1 MG PO TABS: 1.0000 mg | ORAL_TABLET | Freq: Every day | ORAL | Status: DC

## 2013-11-27 MED ORDER — DIVALPROEX SODIUM 500 MG PO DR TAB: 500.0000 mg | DELAYED_RELEASE_TABLET | Freq: Two times a day (BID) | ORAL | Status: DC

## 2013-11-27 MED ORDER — HYDROXYZINE HCL 25 MG PO TABS: 25.0000 mg | ORAL_TABLET | Freq: Four times a day (QID) | ORAL | Status: DC | PRN

## 2013-11-27 MED ORDER — OMEGA-3-ACID ETHYL ESTERS 1 G PO CAPS: 1.0000 g | ORAL_CAPSULE | Freq: Two times a day (BID) | ORAL | Status: DC

## 2013-11-27 MED ORDER — TUBERCULIN PPD 5 UNIT/0.1ML ID SOLN
5.0000 [IU] | Freq: Once | INTRADERMAL | Status: DC
  Administered 2013-11-27: 5 [IU] via INTRADERMAL
  Filled 2013-11-27: qty 0.1

## 2013-11-27 MED ORDER — OLANZAPINE 20 MG PO TABS: 20.0000 mg | ORAL_TABLET | Freq: Every day | ORAL | Status: DC

## 2013-11-27 MED ORDER — QUETIAPINE FUMARATE 25 MG PO TABS: 25.0000 mg | ORAL_TABLET | Freq: Three times a day (TID) | ORAL | Status: DC

## 2013-11-27 MED ORDER — DOCUSATE SODIUM 100 MG PO CAPS: 200.0000 mg | ORAL_CAPSULE | Freq: Two times a day (BID) | ORAL | Status: DC

## 2013-11-27 NOTE — Progress Notes (Signed)
CSW spoke with Mr. [name] [name] he requested that the patient's prescriptions are called in to the [place] pharmacy on [address] and a TB test is initiated,  CSW refaxed the [facility] to the family Care Home [phone] and Catha Nottingham, NP called the medications in to the [facility] pharmacy as requested.  CSW spoke with Mr. [name] [name] who reports he will be here around 8 pm to pick the patient up for discharge.     Maryelizabeth Rowan, MSW, [place], 11/27/2013 Evening Clinical Social Worker [phone]

## 2013-11-27 NOTE — Progress Notes (Signed)
CSW met with Mr. [name] [name] provided him with the original FL2, prescriptions, and TB test.  Patient appears excited about this discharge and willing to go to his new home.      , MSW, LCSWA, 11/27/2013 Evening Clinical Social Worker [phone] 
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
## 2013-11-27 NOTE — [facility] Suicide Risk Assessment (Signed)
Suicide Risk Assessment  Discharge Assessment     Demographic Factors:  Male and Caucasian  Total Time spent with patient: 20 minutes  Psychiatric Specialty Exam:     Blood pressure 118/69, pulse 80, temperature 97.5 F (36.4 C), temperature source Oral, resp. rate 18, SpO2 100.00%.There is no height or weight on file to calculate BMI.  General Appearance: Casual  Eye Contact::  Good  Speech:  Normal Rate  Volume:  Normal  Mood:  Euthymic  Affect:  Congruent  Thought Process:  Coherent  Orientation:  Full (Time, Place, and Person)  Thought Content:  WDL  Suicidal Thoughts:  No  Homicidal Thoughts:  No  Memory:  Immediate;   Good Recent;   Good Remote;   Good  Judgement:  Fair  Insight:  Fair  Psychomotor Activity:  Normal  Concentration:  Good  Recall:  Good  Fund of Knowledge:Fair  Language: Fair  Akathisia:  No  Handed:  Right  AIMS (if indicated):     Assets:  Health and safety inspector Housing Leisure Time Physical Health Resilience Social Support Transportation  Sleep:      Musculoskeletal: Strength & Muscle Tone: within normal limits Gait & Station: normal Patient leans: N/A  Mental Status Per Nursing Assessment::   On Admission:   agitation  Current Mental Status by Physician: NA  Loss Factors: NA  Historical Factors: NA  Risk Reduction Factors:   Living with another person, especially a relative, Positive social support, Positive therapeutic relationship and Positive coping skills or problem solving skills  Continued Clinical Symptoms:  None  Cognitive Features That Contribute To Risk:  Cognitively limited   Suicide Risk:  Minimal: No identifiable suicidal ideation.  Patients presenting with no risk factors but with morbid ruminations; may be classified as minimal risk based on the severity of the depressive symptoms  Discharge Diagnoses:   AXIS I:  Adjustment Disorder with Mixed Disturbance of Emotions and Conduct AXIS II:   Deferred AXIS III:   Past Medical History  Diagnosis Date  . Fatty liver   . Schizophrenia    AXIS IV:  other psychosocial or environmental problems, problems related to social environment and problems with primary support group AXIS V:  61-70 mild symptoms  Plan Of Care/Follow-up recommendations:  Activity:  as tolerated Diet:  low-sodium heart healthy diet  Is patient on multiple antipsychotic therapies at discharge:  No   Has Patient had three or more failed trials of antipsychotic monotherapy by history:  No  Recommended Plan for Multiple Antipsychotic Therapies: NA    [name] [name], PMH-NP 11/27/2013, 10:47 AM

## 2013-11-27 NOTE — Progress Notes (Signed)
CSW made several attempts to contact Mr. Tresa Endo [phone], [phone], and [phone].  It was reported that Mr. Tresa Endo is transporting his current residents from their day programs back to the group home and to continue to try him on the cellphone.  CSW spoke with the guardian Marylene Land [phone] who reports that the previous group home owner Madaline Brilliant [phone] is willing to transport his belongings to the the new home once Mr. Tresa Endo agrees.    Maryelizabeth Rowan, MSW, [place], 11/27/2013 Evening Clinical Social Worker [phone]
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
## 2013-11-27 NOTE — Progress Notes (Addendum)
12:46pm. CSW and pt met with [name] of [facility]. [name] [name] has accept pt and will accept him today.   12:51pm. CSW called pt's guardian to inform her of placement. Guardian informed CSW that she could not get a collection of pt's belongings to new group home until Monday. CSW informed guardian this was not acceptable because pt had been d/c. Guardian stated she would see what she could do, stated she would call [name], and abruptly ended the call.  1:07pm. CSW placed call to guardian's supervisor, [name] [phone]) to inform her that guardian stated she would not be able to move pt's belongings. [name] stated guardian was in room with her presently and once she had investigated case she would call this CSW back.  1:31pm. [name] called back. Stated that guardian will work to get pt's belongings from original group home. If she cannot get belongings, she will make some new purchases to get him some things to get him through the weekend.   [name],     ED CSW  phone: [phone]

## 2013-12-01 NOTE — Consult Note (Signed)
Face to face evaluation and I agree with this note 

## 2015-01-13 ENCOUNTER — Encounter: Payer: Self-pay | Admitting: *Deleted

## 2015-01-13 ENCOUNTER — Other Ambulatory Visit: Payer: Self-pay

## 2015-01-13 ENCOUNTER — Emergency Department
Admission: EM | Admit: 2015-01-13 | Discharge: 2015-01-13 | Disposition: A | Discharge status: Home / Self Care | Payer: Medicare Other | Attending: Emergency Medicine | Admitting: Emergency Medicine

## 2015-01-13 ENCOUNTER — Emergency Department: Payer: Medicare Other

## 2015-01-13 DIAGNOSIS — R0602 Shortness of breath: Secondary | ICD-10-CM | POA: Insufficient documentation

## 2015-01-13 DIAGNOSIS — Z79899 Other long term (current) drug therapy: Secondary | ICD-10-CM | POA: Diagnosis not present

## 2015-01-13 DIAGNOSIS — R1111 Vomiting without nausea: Secondary | ICD-10-CM | POA: Diagnosis not present

## 2015-01-13 LAB — BASIC METABOLIC PANEL
ANION GAP: 9 (ref 5–15)
BUN: 10 mg/dL (ref 6–20)
CO2: 27 mmol/L (ref 22–32)
Calcium: 9.2 mg/dL (ref 8.9–10.3)
Chloride: 101 mmol/L (ref 101–111)
Creatinine, Ser: 0.63 mg/dL (ref 0.61–1.24)
GFR calc Af Amer: >60 mL/min (ref >60)
GLUCOSE: 104 mg/dL — AB (ref 65–99)
POTASSIUM: 3.7 mmol/L (ref 3.5–5.1)
Sodium: 137 mmol/L (ref 135–145)

## 2015-01-13 LAB — CBC
HEMATOCRIT: 43.7 % (ref 40.0–52.0)
Hemoglobin: 15.2 g/dL (ref 13.0–18.0)
MCH: 32 pg (ref 26.0–34.0)
MCHC: 34.7 g/dL (ref 32.0–36.0)
MCV: 92.1 fL (ref 80.0–100.0)
PLATELETS: 214 10*3/uL (ref 150–440)
RBC: 4.74 MIL/uL (ref 4.40–5.90)
RDW: 12.1 % (ref 11.5–14.5)
WBC: 7.5 10*3/uL (ref 3.8–10.6)

## 2015-01-13 LAB — TROPONIN I: Troponin I: <0.03 ng/mL (ref <0.031)

## 2015-01-13 NOTE — ED Notes (Signed)
Pt ambulatory to triage from group home.  Pt states he is short of breath and spitting up clear liquid.  No chest pain.  Pt was released from [facility] yesterday after a 6 month behavioral admission.

## 2015-01-13 NOTE — ED Provider Notes (Signed)
[facility] Emergency Department Provider Note    ____________________________________________  Time seen: 2150  I have reviewed the triage vital signs and the nursing notes.   HISTORY  Chief Complaint Shortness of Breath   History limited by: Not Limited   HPI [name] [name] is a 29 y.o. male who presents to the emergency department today because of intermittent shortness of breath and an episode where he states he vomited up clear liquid. The patient is coming from a group home. He states that he ate something and then was nauseous and vomited up clear liquid. He states he is concerned because he did not vomit up the food or drink he had just ingested. He denied any significant abdominal pain with this. In addition he states he has been having some shortness of breath. Denies any significant chest pain. Denies any recent fevers.   Past Medical History  Diagnosis Date  . Fatty liver   . Schizophrenia [facility])     Patient Active Problem List   Diagnosis Date Noted  . Aggressive behavior 11/25/2013  . 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  Current Outpatient Rx  Name  Route  Sig  Dispense  Refill  . divalproex (DEPAKOTE) 500 MG DR tablet   Oral   Take 1 tablet (500 mg total) by mouth every 12 (twelve) hours.   60 tablet   0   . docusate sodium (COLACE) 100 MG capsule   Oral   Take 2 capsules (200 mg total) by mouth 2 (two) times daily.   10 capsule      . folic acid (FOLVITE) 1 MG tablet   Oral   Take 1 tablet (1 mg total) by mouth daily.         . hydrOXYzine (ATARAX/VISTARIL) 25 MG tablet   Oral   Take 1 tablet (25 mg total) by mouth every 6 (six) hours as needed for anxiety.   30 tablet   0   . LORazepam (ATIVAN) 1 MG tablet   Oral   Take 1 tablet (1 mg total) by mouth every 6 (six) hours as needed for anxiety or sleep.   30 tablet   0   .  lurasidone (LATUDA) 40 MG TABS tablet   Oral   Take 1 tablet (40 mg total) by mouth at bedtime.   30 tablet   0   . OLANZapine (ZYPREXA) 20 MG tablet   Oral   Take 1 tablet (20 mg total) by mouth at bedtime.         Marland Kitchen omega-3 acid ethyl esters (LOVAZA) 1 G capsule   Oral   Take 1 capsule (1 g total) by mouth 2 (two) times daily.         Marland Kitchen omeprazole (PRILOSEC) 20 MG capsule   Oral   Take 1 capsule (20 mg total) by mouth daily.         . ondansetron (ZOFRAN-ODT) 4 MG disintegrating tablet   Oral   Take 4 mg by mouth every 4 (four) hours as needed for nausea or vomiting.         Marland Kitchen QUEtiapine (SEROQUEL) 100 MG tablet   Oral   Take 1 tablet (100 mg total) by mouth at bedtime.   30 tablet   0   . QUEtiapine (SEROQUEL) 25 MG tablet   Oral   Take 1 tablet (25 mg total) by mouth 3 (three) times daily.  90 tablet   0     Allergies Zoloft  No family history on file.  Social History Social History  Substance Use Topics  . Smoking status: Never Smoker   . Smokeless tobacco: Never Used  . Alcohol Use: No    Review of Systems  Constitutional: Negative for fever. Cardiovascular: Negative for chest pain. Respiratory: Positive for shortness of breath. Gastrointestinal: Negative for abdominal pain, vomiting and diarrhea. Genitourinary: Negative for dysuria. Musculoskeletal: Negative for back pain. Skin: Negative for rash. Neurological: Negative for headaches, focal weakness or numbness.  10-point ROS otherwise negative.  ____________________________________________   PHYSICAL EXAM:  VITAL SIGNS: ED Triage Vitals  Enc Vitals Group     BP 01/13/15 1919 158/76 mmHg     Pulse Rate 01/13/15 1919 108     Resp --      Temp 01/13/15 1919 98.5 F (36.9 C)     Temp Source 01/13/15 1919 Oral     SpO2 01/13/15 1919 97 %     Weight 01/13/15 1919 205 lb (92.987 kg)     Height 01/13/15 1919  (1.778 m)     Head Cir --      Peak Flow --      Pain Score  01/13/15 1919 5   Constitutional: Alert and oriented. Well appearing and in no distress. Eyes: Conjunctivae are normal. PERRL. Normal extraocular movements. ENT   Head: Normocephalic and atraumatic.   Nose: No congestion/rhinnorhea.   Mouth/Throat: Mucous membranes are moist.   Neck: No stridor. Hematological/Lymphatic/Immunilogical: No cervical lymphadenopathy. Cardiovascular: Normal rate, regular rhythm.  No murmurs, rubs, or gallops. Respiratory: Normal respiratory effort without tachypnea nor retractions. Breath sounds are clear and equal bilaterally. No wheezes/rales/rhonchi. Gastrointestinal: Soft and nontender. No distention. There is no CVA tenderness. Genitourinary: Deferred Musculoskeletal: Normal range of motion in all extremities. No joint effusions.  No lower extremity tenderness nor edema. Neurologic:  Normal speech and language. No gross focal neurologic deficits are appreciated. Speech is normal.  Skin:  Skin is warm, dry and intact. No rash noted. Psychiatric: Mood and affect are normal. Speech and behavior are normal. Patient exhibits appropriate insight and judgment.  ____________________________________________    LABS (pertinent positives/negatives)  Labs Reviewed  BASIC METABOLIC PANEL - Abnormal; Notable for the following:    Glucose, Bld 104 (*)    All other components within normal limits  CBC  TROPONIN I     ____________________________________________   EKG  I, Phineas Semen, attending physician, personally viewed and interpreted this EKG  EKG Time: 1924 Rate: 99 Rhythm: NSR Axis: Normal Intervals: qtc 428 QRS: Narrow ST changes: no st elevation Impression: normal ekg ____________________________________________    RADIOLOGY  CXR IMPRESSION: No acute pulmonary process.  I, Nagee Goates, personally viewed and evaluated these images (plain radiographs) as part of my medical decision  making. ____________________________________________   PROCEDURES  Procedure(s) performed: None  Critical Care performed: No  ____________________________________________   INITIAL IMPRESSION / ASSESSMENT AND PLAN / ED COURSE  Pertinent labs & imaging results that were available during my care of the patient were reviewed by me and considered in my medical decision making (see chart for details).  Patient presented to the emergency department today with concern for some shortness of breath and emesis of a clear liquid. Chest x-ray blood work and EKG here without any concerning findings. Patient did have a recent hospitalization however he was getting up and doing recreational activities with grips. He was certainly not bedbound. He does not  have any overt pain. I highly doubt pulmonary embolism. Will discharge home with return precautions.  ____________________________________________   FINAL CLINICAL IMPRESSION(S) / ED DIAGNOSES  Final diagnoses:  Shortness of breath  Vomiting without nausea, vomiting of unspecified type     Phineas SemenGraydon Tapanga [place], MD 01/13/15 2115

## 2015-01-13 NOTE — BHH Counselor (Addendum)
Received phone call from patient's Guardian(Cassandra [phone]) stating the patient was on the way to the ER. [facility] was [facility] on last night, 01/12/2015, after being there for 6 months.  Patient is currently in a Group Home, with two staff assigned to him. Due to the severity of his case, his Psychiatrist is the Medical Director of [employer] (Dr. Karle PlumberBeverly Agnello) and Dr. Samuella CotaPrice is also helping. They are providing the Group Home staff with Technical Assistance on how to work with the patient.  Patient reported throughout the day he didn't want to be at the Group Home. Latter this evening, he told staff he was having anxiety and that he needed to go to the hospital. Staff made attempts to redirect him, but was unsuccessful.  His care team(Group Home, Psychiatrist and Guardian), is requesting the patient is seen, cleared and discharged back to the Group Home. They are wanting to keep patient out of the ER and hospitals.  Writer advised the Guardian to have their Psych MD call the ER MD (Dr. Inocencio HomesGayle) and have them talk to each other. Provided her with the number.  Write also spoke with ER MD (Dr. Inocencio HomesGayle) and informed her of the situation.  Guardianship Appointment papers, Treatment Plan & Notes [facility] was received via fax and placed on his paper chart.

## 2015-01-13 NOTE — Discharge Instructions (Signed)
Please seek medical attention for any high fevers, chest pain, shortness of breath, change in behavior, persistent vomiting, bloody stool or any other new or concerning symptoms. ° ° °Nausea and Vomiting °Nausea is a sick feeling that often comes before throwing up (vomiting). Vomiting is a reflex where stomach contents come out of your mouth. Vomiting can cause severe loss of body fluids (dehydration). Children and elderly adults can become dehydrated quickly, especially if they also have diarrhea. Nausea and vomiting are symptoms of a condition or disease. It is important to find the cause of your symptoms. °CAUSES  °· Direct irritation of the stomach lining. This irritation can result from increased acid production (gastroesophageal reflux disease), infection, food poisoning, taking certain medicines (such as nonsteroidal anti-inflammatory drugs), alcohol use, or tobacco use. °· Signals from the brain. These signals could be caused by a headache, heat exposure, an inner ear disturbance, increased pressure in the brain from injury, infection, a tumor, or a concussion, pain, emotional stimulus, or metabolic problems. °· An obstruction in the gastrointestinal tract (bowel obstruction). °· Illnesses such as diabetes, hepatitis, gallbladder problems, appendicitis, kidney problems, cancer, sepsis, atypical symptoms of a heart attack, or eating disorders. °· Medical treatments such as chemotherapy and radiation. °· Receiving medicine that makes you sleep (general anesthetic) during surgery. °DIAGNOSIS °Your caregiver may ask for tests to be done if the problems do not improve after a few days. Tests may also be done if symptoms are severe or if the reason for the nausea and vomiting is not clear. Tests may include: °· Urine tests. °· Blood tests. °· Stool tests. °· Cultures (to look for evidence of infection). °· X-rays or other imaging studies. °Test results can help your caregiver make decisions about treatment or the  need for additional tests. °TREATMENT °You need to stay well hydrated. Drink frequently but in small amounts. You may wish to drink water, sports drinks, clear broth, or eat frozen ice pops or gelatin dessert to help stay hydrated. When you eat, eating slowly may help prevent nausea. There are also some antinausea medicines that may help prevent nausea. °HOME CARE INSTRUCTIONS  °· Take all medicine as directed by your caregiver. °· If you do not have an appetite, do not force yourself to eat. However, you must continue to drink fluids. °· If you have an appetite, eat a normal diet unless your caregiver tells you differently. °¨ Eat a variety of complex carbohydrates (rice, wheat, potatoes, bread), lean meats, yogurt, fruits, and vegetables. °¨ Avoid high-fat foods because they are more difficult to digest. °· Drink enough water and fluids to keep your urine clear or pale yellow. °· If you are dehydrated, ask your caregiver for specific rehydration instructions. Signs of dehydration may include: °¨ Severe thirst. °¨ Dry lips and mouth. °¨ Dizziness. °¨ Dark urine. °¨ Decreasing urine frequency and amount. °¨ Confusion. °¨ Rapid breathing or pulse. °SEEK IMMEDIATE MEDICAL CARE IF:  °· You have blood or brown flecks (like coffee grounds) in your vomit. °· You have black or bloody stools. °· You have a severe headache or stiff neck. °· You are confused. °· You have severe abdominal pain. °· You have chest pain or trouble breathing. °· You do not urinate at least once every 8 hours. °· You develop cold or clammy skin. °· You continue to vomit for longer than 24 to 48 hours. °· You have a fever. °MAKE SURE YOU:  °· Understand these instructions. °· Will watch your condition. °· Will get help right away   if you are not doing well or get worse. °  °This information is not intended to replace advice given to you by your health care provider. Make sure you discuss any questions you have with your health care provider. °    °Document Released: 03/12/2005 Document Revised: 06/04/2011 Document Reviewed: 08/09/2010 °Elsevier Interactive Patient Education ©2016 [employer] ° °

## 2015-01-16 ENCOUNTER — Emergency Department
Admission: EM | Admit: 2015-01-16 | Discharge: 2015-01-17 | Disposition: A | Discharge status: Home / Self Care | Payer: Medicare Other | Attending: Emergency Medicine | Admitting: Emergency Medicine

## 2015-01-16 ENCOUNTER — Encounter: Payer: Self-pay | Admitting: Emergency Medicine

## 2015-01-16 DIAGNOSIS — Z79899 Other long term (current) drug therapy: Secondary | ICD-10-CM | POA: Insufficient documentation

## 2015-01-16 DIAGNOSIS — F3160 Bipolar disorder, current episode mixed, unspecified: Secondary | ICD-10-CM

## 2015-01-16 DIAGNOSIS — F209 Schizophrenia, unspecified: Secondary | ICD-10-CM

## 2015-01-16 DIAGNOSIS — Z008 Encounter for other general examination: Secondary | ICD-10-CM | POA: Diagnosis present

## 2015-01-16 DIAGNOSIS — F200 Paranoid schizophrenia: Secondary | ICD-10-CM | POA: Diagnosis not present

## 2015-01-16 DIAGNOSIS — F4325 Adjustment disorder with mixed disturbance of emotions and conduct: Secondary | ICD-10-CM | POA: Diagnosis present

## 2015-01-16 HISTORY — DX: Bipolar disorder, unspecified: F31.9

## 2015-01-16 HISTORY — DX: Anxiety disorder, unspecified: F41.9

## 2015-01-16 HISTORY — DX: Major depressive disorder, single episode, unspecified: F32.9

## 2015-01-16 HISTORY — DX: Depression, unspecified: F32.A

## 2015-01-16 LAB — COMPREHENSIVE METABOLIC PANEL
ALBUMIN: 4.1 g/dL (ref 3.5–5.0)
ALK PHOS: 57 U/L (ref 38–126)
ALT: 22 U/L (ref 17–63)
ANION GAP: 6 (ref 5–15)
AST: 23 U/L (ref 15–41)
BILIRUBIN TOTAL: 0.8 mg/dL (ref 0.3–1.2)
BUN: 7 mg/dL (ref 6–20)
CO2: 29 mmol/L (ref 22–32)
Calcium: 9.1 mg/dL (ref 8.9–10.3)
Chloride: 107 mmol/L (ref 101–111)
Creatinine, Ser: 0.64 mg/dL (ref 0.61–1.24)
GFR calc Af Amer: >60 mL/min (ref >60)
GFR calc non Af Amer: >60 mL/min (ref >60)
Glucose, Bld: 111 mg/dL — ABNORMAL HIGH (ref 65–99)
Potassium: 3.7 mmol/L (ref 3.5–5.1)
Sodium: 142 mmol/L (ref 135–145)
TOTAL PROTEIN: 7.3 g/dL (ref 6.5–8.1)

## 2015-01-16 LAB — SALICYLATE LEVEL: Salicylate Lvl: <4 mg/dL (ref 2.8–30.0)

## 2015-01-16 LAB — CBC
HCT: 45.3 % (ref 40.0–52.0)
Hemoglobin: 15.4 g/dL (ref 13.0–18.0)
MCH: 31.3 pg (ref 26.0–34.0)
MCHC: 34 g/dL (ref 32.0–36.0)
MCV: 92 fL (ref 80.0–100.0)
Platelets: 223 10*3/uL (ref 150–440)
RBC: 4.92 MIL/uL (ref 4.40–5.90)
RDW: 12 % (ref 11.5–14.5)
WBC: 6 10*3/uL (ref 3.8–10.6)

## 2015-01-16 LAB — ACETAMINOPHEN LEVEL

## 2015-01-16 LAB — ETHANOL: Alcohol, Ethyl (B): <5 mg/dL (ref <5)

## 2015-01-16 NOTE — ED Provider Notes (Signed)
[facility] Emergency Department Provider Note  ____________________________________________  Time seen:  1905  I have reviewed the triage vital signs and the nursing notes.   HISTORY  Chief Complaint Psychiatric Evaluation     HPI [name] [name] is a 29 y.o. male who reports he had A. tach from earlier today at the group home that he has been on for the past 5 days.    He does have a history of schizophrenia and aggressive behavior. He reports that he has been hospitalized at [facility] for multiple months before being discharged approximate 5 days ago to the group home.  Patient reports that with this tantrum he did throw a chair towards the wall. He is fairly forthright in reporting that he did not throw anything towards any person and he tried not to be aggressive towards any specific person. He appears to have some relatively fair insight.   Patient reports she does not like group home. He feels that it is not working out. He would like to find a different group home.  The patient did present emergency department 4 days ago for shortness of breath. He reports he still has intermittent problems with this but no shortness of breath currently.   Past Medical History  Diagnosis Date  . Fatty liver   . Schizophrenia (HCC)   . Depression   . Anxiety   . Bipolar affective disorder [facility])     Patient Active Problem List   Diagnosis Date Noted  . Aggressive behavior 11/25/2013  . 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  Current Outpatient Rx  Name  Route  Sig  Dispense  Refill  . divalproex (DEPAKOTE) 500 MG DR tablet   Oral   Take 1 tablet (500 mg total) by mouth every 12 (twelve) hours. Patient taking differently: Take 1,500 mg by mouth once.    60 tablet   0   . haloperidol (HALDOL) 2 MG tablet   Oral   Take 2 mg by mouth every 6 (six) hours as  needed for agitation.         Marland Kitchen omeprazole (PRILOSEC) 20 MG capsule   Oral   Take 1 capsule (20 mg total) by mouth daily. Patient taking differently: Take 40 mg by mouth daily.          . paliperidone (INVEGA SUSTENNA) 156 MG/ML SUSP injection   Intramuscular   Inject 156 mg into the muscle once.         Marland Kitchen zolpidem (AMBIEN) 10 MG tablet   Oral   Take 10 mg by mouth at bedtime as needed for sleep.         Marland Kitchen docusate sodium (COLACE) 100 MG capsule   Oral   Take 2 capsules (200 mg total) by mouth 2 (two) times daily.   10 capsule      . folic acid (FOLVITE) 1 MG tablet   Oral   Take 1 tablet (1 mg total) by mouth daily.         . hydrOXYzine (ATARAX/VISTARIL) 25 MG tablet   Oral   Take 1 tablet (25 mg total) by mouth every 6 (six) hours as needed for anxiety.   30 tablet   0   . LORazepam (ATIVAN) 1 MG tablet   Oral   Take 1 tablet (1 mg total) by mouth every 6 (six) hours as needed for anxiety or sleep. Patient taking  differently: Take 2 mg by mouth every 6 (six) hours as needed for anxiety or sleep.    30 tablet   0   . lurasidone (LATUDA) 40 MG TABS tablet   Oral   Take 1 tablet (40 mg total) by mouth at bedtime. Patient taking differently: Take 60 mg by mouth at bedtime.    30 tablet   0   . OLANZapine (ZYPREXA) 20 MG tablet   Oral   Take 1 tablet (20 mg total) by mouth at bedtime.         Marland Kitchen omega-3 acid ethyl esters (LOVAZA) 1 G capsule   Oral   Take 1 capsule (1 g total) by mouth 2 (two) times daily.         . ondansetron (ZOFRAN-ODT) 4 MG disintegrating tablet   Oral   Take 4 mg by mouth every 4 (four) hours as needed for nausea or vomiting.         Marland Kitchen QUEtiapine (SEROQUEL) 100 MG tablet   Oral   Take 1 tablet (100 mg total) by mouth at bedtime.   30 tablet   0   . QUEtiapine (SEROQUEL) 25 MG tablet   Oral   Take 1 tablet (25 mg total) by mouth 3 (three) times daily.   90 tablet   0     Allergies Ibuprofen and Zoloft  No  family history on file.  Social History Social History  Substance Use Topics  . Smoking status: Never Smoker   . Smokeless tobacco: Never Used  . Alcohol Use: No    Review of Systems  Constitutional: Negative for fever. ENT: Negative for sore throat. Cardiovascular: Negative for chest pain. Respiratory: Negative for cough. Gastrointestinal: Negative for abdominal pain, vomiting and diarrhea. Genitourinary: Negative for dysuria. Musculoskeletal: No myalgias or injuries. Skin: Negative for rash. Neurological: Negative for paresthesia or weakness Psychiatric: Hot and somewhat flat affect. "Tantrum" earlier today.  10-point ROS otherwise negative.  ____________________________________________   PHYSICAL EXAM:  VITAL SIGNS: ED Triage Vitals  Enc Vitals Group     BP 01/16/15 1813 124/74 mmHg     Pulse Rate 01/16/15 1813 103     Resp 01/16/15 1813 18     Temp 01/16/15 1813 98.1 F (36.7 C)     Temp Source 01/16/15 1813 Oral     SpO2 01/16/15 1813 96 %     Weight 01/16/15 1813 187 lb (84.823 kg)     Height 01/16/15 1813  (1.803 m)     Head Cir --      Peak Flow --      Pain Score 01/16/15 1810 0     Pain Loc --      Pain Edu? --      Excl. in GC? --     Constitutional: Alert and oriented. Well appearing and in no distress. ENT   Head: Normocephalic and atraumatic.   Nose: No congestion/rhinnorhea.    Cardiovascular: Normal rate, regular rhythm, no murmur noted Respiratory:  Normal respiratory effort, no tachypnea.    Breath sounds are clear and equal bilaterally.  Gastrointestinal: Soft and nontender. No distention.  Back: No muscle spasm, no tenderness, no CVA tenderness. Musculoskeletal: No deformity noted. Nontender with normal range of motion in all extremities.  No noted edema. Neurologic:  Normal speech and language. No gross focal neurologic deficits are appreciated.  Skin:  Skin is warm, dry. No rash noted. Psychiatric: Somewhat I did an flat  affect. Patient denies thoughts of self-harm  or hurting others. His tantrum/agitation earlier seems to have resolved.  ____________________________________________    LABS (pertinent positives/negatives)  Labs Reviewed  COMPREHENSIVE METABOLIC PANEL - Abnormal; Notable for the following:    Glucose, Bld 111 (*)    All other components within normal limits  ACETAMINOPHEN LEVEL - Abnormal; Notable for the following:    Acetaminophen (Tylenol), Serum <10 (*)    All other components within normal limits  ETHANOL  SALICYLATE LEVEL  CBC  URINE DRUG SCREEN, QUALITATIVE ([facility] ONLY)     ____________________________________________  Consult: Psychiatric, specialist on-call: ____________________________________________   INITIAL IMPRESSION / ASSESSMENT AND PLAN / ED COURSE  Pertinent labs & imaging results that were available during my care of the patient were reviewed by me and considered in my medical decision making (see chart for details).  Overall, cooperative 29 year old male with a history of schizophrenia. He is unhappy at the group home currently. He feels it is not working out.  I do not see any indication for psychiatric hospitalization. Ideally, he would return to the group home and they would work through either the issues there or find a new residence for him.  We will obtain a psychiatric consultation via Tele-psychiatry to reassess his psychiatric state.  If they agree with my assessment, will spit group on about returning tonight. The patient appears a little apprehensive about this plan but agrees to it. He reports the group home may not want to take him back.  ----------------------------------------- 11:59 PM on 01/16/2015 -----------------------------------------  The patient was seen by a specialist on-call psychiatry. They agree with discharge back to the group home. They initially were making some medication change recommendations, but we discover that they were  based on the wrong current med list. We have called the psychiatrist back and reviewed this with him. He reports make no changes this time but continue the current medications that were prescribed when the patient was recently released from the psychiatric facility 5 days ago.  The patient has continued to be cooperative and calm here in the emergency department.  The nursing staff has spoken with a care home. They will come and pick the patient not. ____________________________________________   FINAL CLINICAL IMPRESSION(S) / ED DIAGNOSES  Final diagnoses:  Bipolar affective disorder, current episode mixed, current episode severity unspecified (HCC)      Darien Ramusavid W Honesti Seaberg, MD 01/17/15 0002

## 2015-01-16 NOTE — ED Notes (Signed)
Pt states he was D/C from [facility] 5 days ago. Pt states he has had increased anger and has been throwing chairs today at the group home. Pt states he gets angry and feels like he wants to hurt someone but he states he doesn't.

## 2015-01-17 ENCOUNTER — Emergency Department
Admission: EM | Admit: 2015-01-17 | Discharge: 2015-01-18 | Disposition: A | Discharge status: Home / Self Care | Payer: Medicare Other | Source: Home / Self Care | Attending: Emergency Medicine | Admitting: Emergency Medicine

## 2015-01-17 ENCOUNTER — Encounter: Payer: Self-pay | Admitting: *Deleted

## 2015-01-17 DIAGNOSIS — F3160 Bipolar disorder, current episode mixed, unspecified: Secondary | ICD-10-CM | POA: Diagnosis not present

## 2015-01-17 DIAGNOSIS — F200 Paranoid schizophrenia: Secondary | ICD-10-CM

## 2015-01-17 DIAGNOSIS — F209 Schizophrenia, unspecified: Secondary | ICD-10-CM

## 2015-01-17 DIAGNOSIS — R451 Restlessness and agitation: Secondary | ICD-10-CM

## 2015-01-17 LAB — COMPREHENSIVE METABOLIC PANEL
ALT: 25 U/L (ref 17–63)
AST: 22 U/L (ref 15–41)
Albumin: 4.1 g/dL (ref 3.5–5.0)
Alkaline Phosphatase: 52 U/L (ref 38–126)
Anion gap: 6 (ref 5–15)
BUN: 7 mg/dL (ref 6–20)
CHLORIDE: 105 mmol/L (ref 101–111)
CO2: 28 mmol/L (ref 22–32)
CREATININE: 0.62 mg/dL (ref 0.61–1.24)
Calcium: 8.8 mg/dL — ABNORMAL LOW (ref 8.9–10.3)
Glucose, Bld: 97 mg/dL (ref 65–99)
Potassium: 3.6 mmol/L (ref 3.5–5.1)
SODIUM: 139 mmol/L (ref 135–145)
Total Bilirubin: 0.7 mg/dL (ref 0.3–1.2)
Total Protein: 7.1 g/dL (ref 6.5–8.1)

## 2015-01-17 LAB — CBC WITH DIFFERENTIAL/PLATELET
Basophils Absolute: 0.1 10*3/uL (ref 0–0.1)
Basophils Relative: 1 %
EOS ABS: 0.1 10*3/uL (ref 0–0.7)
EOS PCT: 2 %
HCT: 45.4 % (ref 40.0–52.0)
HEMOGLOBIN: 15.6 g/dL (ref 13.0–18.0)
LYMPHS ABS: 1.9 10*3/uL (ref 1.0–3.6)
LYMPHS PCT: 27 %
MCH: 31.6 pg (ref 26.0–34.0)
MCHC: 34.4 g/dL (ref 32.0–36.0)
MCV: 91.8 fL (ref 80.0–100.0)
MONOS PCT: 10 %
Monocytes Absolute: 0.7 10*3/uL (ref 0.2–1.0)
Neutro Abs: 4.2 10*3/uL (ref 1.4–6.5)
Neutrophils Relative %: 60 %
PLATELETS: 228 10*3/uL (ref 150–440)
RBC: 4.95 MIL/uL (ref 4.40–5.90)
RDW: 12.2 % (ref 11.5–14.5)
WBC: 7 10*3/uL (ref 3.8–10.6)

## 2015-01-17 LAB — URINE DRUG SCREEN, QUALITATIVE (ARMC ONLY)
Amphetamines, Ur Screen: NOT DETECTED
BARBITURATES, UR SCREEN: NOT DETECTED
Benzodiazepine, Ur Scrn: POSITIVE — AB
COCAINE METABOLITE, UR ~~LOC~~: NOT DETECTED
Cannabinoid 50 Ng, Ur ~~LOC~~: NOT DETECTED
MDMA (ECSTASY) UR SCREEN: NOT DETECTED
METHADONE SCREEN, URINE: NOT DETECTED
OPIATE, UR SCREEN: NOT DETECTED
Phencyclidine (PCP) Ur S: NOT DETECTED
TRICYCLIC, UR SCREEN: NOT DETECTED

## 2015-01-17 LAB — SALICYLATE LEVEL: Salicylate Lvl: <4 mg/dL (ref 2.8–30.0)

## 2015-01-17 LAB — ACETAMINOPHEN LEVEL

## 2015-01-17 LAB — ETHANOL: Alcohol, Ethyl (B): <5 mg/dL (ref <5)

## 2015-01-17 NOTE — Discharge Instructions (Signed)
[name] [name] was seen by a psychiatrist through a computer interface this evening. They have reviewed his current medication list and recommended no changes. Continue his current care. Follow-up with the mental health provider that has originally been planned. Return to the emergency department if there are any further urgent behavioral problems.  Bipolar Disorder Bipolar disorder is a mental illness. The term bipolar disorder actually is used to describe a group of disorders that all share varying degrees of emotional highs and lows that can interfere with daily functioning, such as work, school, or relationships. Bipolar disorder also can lead to drug abuse, hospitalization, and suicide. The emotional highs of bipolar disorder are periods of elation or irritability and high energy. These highs can range from a mild form (hypomania) to a severe form (mania). People experiencing episodes of hypomania may appear energetic, excitable, and highly productive. People experiencing mania may behave impulsively or erratically. They often make poor decisions. They may have difficulty sleeping. The most severe episodes of mania can involve having very distorted beliefs or perceptions about the world and seeing or hearing things that are not real (psychotic delusions and hallucinations).  The emotional lows of bipolar disorder (depression) also can range from mild to severe. Severe episodes of bipolar depression can involve psychotic delusions and hallucinations. Sometimes people with bipolar disorder experience a state of mixed mood. Symptoms of hypomania or mania and depression are both present during this mixed-mood episode. SIGNS AND SYMPTOMS There are signs and symptoms of the episodes of hypomania and mania as well as the episodes of depression. The signs and symptoms of hypomania and mania are similar but vary in severity. They include:  Inflated self-esteem or feeling of increased  self-confidence.  Decreased need for sleep.  Unusual talkativeness (rapid or pressured speech) or the feeling of a need to keep talking.  Sensation of racing thoughts or constant talking, with quick shifts between topics that may or may not be related (flight of ideas).  Decreased ability to focus or concentrate.  Increased purposeful activity, such as work, studies, or social activity, or nonproductive activity, such as pacing, squirming and fidgeting, or finger and toe tapping.  Impulsive behavior and use of poor judgment, resulting in high-risk activities, such as having unprotected sex or spending excessive amounts of money. Signs and symptoms of depression include the following:   Feelings of sadness, hopelessness, or helplessness.  Frequent or uncontrollable episodes of crying.  Lack of feeling anything or caring about anything.  Difficulty sleeping or sleeping too much.  Inability to enjoy the things you used to enjoy.   Desire to be alone all the time.   Feelings of guilt or worthlessness.  Lack of energy or motivation.   Difficulty concentrating, remembering, or making decisions.  Change in appetite or weight beyond normal fluctuations.  Thoughts of death or the desire to harm yourself. DIAGNOSIS  Bipolar disorder is diagnosed through an assessment by your caregiver. Your caregiver will ask questions about your emotional episodes. There are two main types of bipolar disorder. People with type I bipolar disorder have manic episodes with or without depressive episodes. People with type II bipolar disorder have hypomanic episodes and major depressive episodes, which are more serious than mild depression. The type of bipolar disorder you have can make an important difference in how your illness is monitored and treated. Your caregiver may ask questions about your medical history and use of alcohol or drugs, including prescription medication. Certain medical conditions  and substances also can  cause emotional highs and lows that resemble bipolar disorder (secondary bipolar disorder).  TREATMENT  Bipolar disorder is a long-term illness. It is best controlled with continuous treatment rather than treatment only when symptoms occur. The following treatments can be prescribed for bipolar disorders:  Medication--Medication can be prescribed by a doctor that is an expert in treating mental disorders (psychiatrists). Medications called mood stabilizers are usually prescribed to help control the illness. Other medications are sometimes added if symptoms of mania, depression, or psychotic delusions and hallucinations occur despite the use of a mood stabilizer.  Talk therapy--Some forms of talk therapy are helpful in providing support, education, and guidance. A combination of medication and talk therapy is best for managing the disorder over time. A procedure in which electricity is applied to your brain through your scalp (electroconvulsive therapy) is used in cases of severe mania when medication and talk therapy do not work or work too slowly.   This information is not intended to replace advice given to you by your health care provider. Make sure you discuss any questions you have with your health care provider.   Document Released: 06/18/2000 Document Revised: 04/02/2014 Document Reviewed: 04/07/2012 Elsevier Interactive Patient Education [employer]2016 [employer] Inc.

## 2015-01-17 NOTE — BHH Counselor (Signed)
Writer spoke with guardian Morey Hummingbird(Cassandra Massenburg [phone]- [employer]) who stated that Pt has been verbalizing that he wants to come to the hospital and would "act out" until admitted. Writer explained that Pt would be observed overnight and assessed in the morning for possible discharge (per MD Psych Dr. Jennet MaduroPucilowska). Guardian stated that Pt resides at [facility] and that she would contact the group home in the morning. Guardian did not have Group home contact information at her disposal.

## 2015-01-17 NOTE — BHH Counselor (Signed)
Attempted to contact Pt. Guardian Morey Hummingbird(Cassandra Massenburg [phone]- [employer]) left voicemail to return call.

## 2015-01-17 NOTE — BHH Counselor (Signed)
Writer consulted with Psych MD Dr. Jennet MaduroPucilowska. Pt is to be held overnight and evaluated in the morning for possible discharge. Writer has informed EDP Dr. Lenard LancePaduchowski.
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
## 2015-01-17 NOTE — ED Notes (Signed)
Pt arrives voluntary via [facility] from group home. Pt reports he was throwing boxes because he was "agitated" "at first I wanted to hurt someone, but then I didn't, I'm still angry, but I don't want to hurt anybody". Denies SI.

## 2015-01-18 DIAGNOSIS — F209 Schizophrenia, unspecified: Secondary | ICD-10-CM

## 2015-01-18 DIAGNOSIS — F3160 Bipolar disorder, current episode mixed, unspecified: Secondary | ICD-10-CM | POA: Diagnosis not present

## 2015-01-18 MED ORDER — DIVALPROEX SODIUM 500 MG PO DR TAB
DELAYED_RELEASE_TABLET | ORAL | Status: AC
  Administered 2015-01-18: 1500 mg via ORAL
  Filled 2015-01-18: qty 3

## 2015-01-18 MED ORDER — DIVALPROEX SODIUM 500 MG PO DR TAB
1500.0000 mg | DELAYED_RELEASE_TABLET | ORAL | Status: AC
  Administered 2015-01-18: 1500 mg via ORAL

## 2015-01-18 NOTE — ED Notes (Signed)
Patient aware that he is to be discharged later today.  Maintained on 15 minute checks and observation by security camera for safety.

## 2015-01-18 NOTE — ED Notes (Addendum)
Report received from SW, RN. Pt. Alert and oriented in no distress denies SI, HI, AVH and pain.  Pt. Instructed to come to me with problems or concerns.Will continue to monitor for safety via security cameras and Q 15 minute checks.

## 2015-01-18 NOTE — ED Provider Notes (Signed)
-----------------------------------------   3:17 PM on 01/18/2015 -----------------------------------------  Case discussed with Dr. Delaney Meigslaypacs who has evaluated the patient and recommends discharge home with case management and the patient's intensive outpatient provider. His group home staff will be here shortly to pick him up.  Gayla DossEryka A Jefte Carithers, MD 01/18/15 [id number]
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
## 2015-01-18 NOTE — ED Notes (Signed)
Patient discharged ambulatory to caregivers of group home. He denies SI or HI. Discharge instructions reviewed with patient and caregivers, they verbalize understanding. Patient received copy of DC instructions and all personal belongings.

## 2015-01-18 NOTE — Progress Notes (Signed)
TTS followed-up with the pts group home and they have agreed to pick the pt up at 3pm today.   01/18/2015 Cheryl FlashNicole Danasia Baker, MS, NCC, LPCA Therapeutic Triage Specialist

## 2015-01-18 NOTE — ED Notes (Signed)
Patient resting quietly in room. No noted distress or abnormal behaviors noted. Will continue 15 minute checks and observation by security camera for safety. 

## 2015-01-18 NOTE — ED Notes (Signed)
Patient asleep in room. No noted distress or abnormal behavior. Will continue 15 minute checks and observation by security cameras for safety. 

## 2015-01-18 NOTE — ED Provider Notes (Signed)
-----------------------------------------   7:20 AM on 01/18/2015 -----------------------------------------   Blood pressure 116/85, pulse 106, temperature 98.6 F (37 C), resp. rate 18, height 5\' 11"  (1.803 m), weight 187 lb (84.823 kg), SpO2 96 %.  The patient had no acute events since last update.  Calm and cooperative at this time.    Plan for observation today and evaluation for possible discharge.     Rebecka ApleyAllison P Kieran Arreguin, MD 01/18/15 [phone]

## 2015-01-18 NOTE — ED Notes (Signed)
Pt. Noted in room resting quietly;. No complaints or concerns voiced. No distress or abnormal behavior noted. Will continue to monitor with security cameras. Q 15 minute rounds continue. 

## 2015-01-18 NOTE — ED Notes (Signed)
Patient cooperative with all nursing interventions. He states he is here just to get his medications adjusted. He has no specific complaints. Maintained on 15 minute checks and observation by security camera for safety.

## 2015-01-18 NOTE — Progress Notes (Addendum)
Per [name] the pt is to be discharged. [facility] [place] [phone]@ [phone]). TTS spoke with staff member Mellody DanceKeith who stated that the pts care coordinator has requested that the patients medications be changed/adjusted. TTS has referred the facility to the pts PCP/Outpatient prescriber  to facilitate medication management. TTS awaiting a return call.   01/18/2015 Cheryl FlashNicole Vegas Fritze, MS, NCC, LPCA Therapeutic Triage Specialist

## 2015-01-18 NOTE — ED Notes (Signed)
Meal given. Maintained on 15 minute checks and observation by security camera for safety. 

## 2015-01-18 NOTE — ED Notes (Signed)
Patient meeting with MD. In no apparent distress.  Maintained on 15 minute checks and observation by security camera for safety.

## 2015-01-18 NOTE — ED Notes (Signed)
Pt. Noted in room gl resting quietly. No complaints or concerns voiced. No distress or abnormal behavior noted. Will continue to monitor with security cameras. Q 15 minute rounds continue.
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
## 2015-01-18 NOTE — ED Notes (Signed)
Pt. To EDBHU from ED ambulatory without difficulty, to room BHU-4  . Report from RN. Pt. Is alert and oriented, warm and dry in no distress. Pt. Denies SI, HI, and AVH. Pt. Calm and cooperative. Pt. Made aware of security cameras and Q15 minute rounds. Pt. Encouraged to let Nursing staff know of any concerns or needs.

## 2015-01-18 NOTE — ED Notes (Signed)

## 2015-01-18 NOTE — ED Notes (Signed)
Pt. Noted in  room watching the tv.;. No complaints or concerns voiced. No distress or abnormal behavior noted. Will continue to monitor with security cameras. Q 15 minute rounds continue. 

## 2015-03-14 ENCOUNTER — Encounter: Payer: Self-pay | Admitting: Emergency Medicine

## 2015-03-14 ENCOUNTER — Emergency Department
Admission: EM | Admit: 2015-03-14 | Discharge: 2015-03-15 | Disposition: A | Discharge status: Home / Self Care | Payer: Medicare Other | Attending: Emergency Medicine | Admitting: Emergency Medicine

## 2015-03-14 DIAGNOSIS — F209 Schizophrenia, unspecified: Secondary | ICD-10-CM | POA: Diagnosis not present

## 2015-03-14 DIAGNOSIS — F912 Conduct disorder, adolescent-onset type: Secondary | ICD-10-CM | POA: Insufficient documentation

## 2015-03-14 DIAGNOSIS — F203 Undifferentiated schizophrenia: Secondary | ICD-10-CM | POA: Diagnosis not present

## 2015-03-14 DIAGNOSIS — Z79899 Other long term (current) drug therapy: Secondary | ICD-10-CM | POA: Diagnosis not present

## 2015-03-14 DIAGNOSIS — F1310 Sedative, hypnotic or anxiolytic abuse, uncomplicated: Secondary | ICD-10-CM | POA: Insufficient documentation

## 2015-03-14 DIAGNOSIS — Z008 Encounter for other general examination: Secondary | ICD-10-CM | POA: Diagnosis present

## 2015-03-14 DIAGNOSIS — F319 Bipolar disorder, unspecified: Secondary | ICD-10-CM | POA: Insufficient documentation

## 2015-03-14 DIAGNOSIS — R4689 Other symptoms and signs involving appearance and behavior: Secondary | ICD-10-CM

## 2015-03-14 LAB — COMPREHENSIVE METABOLIC PANEL
ALBUMIN: 4.1 g/dL (ref 3.5–5.0)
ALK PHOS: 55 U/L (ref 38–126)
ALT: 103 U/L — ABNORMAL HIGH (ref 17–63)
ANION GAP: 10 (ref 5–15)
AST: 51 U/L — AB (ref 15–41)
BILIRUBIN TOTAL: 1.2 mg/dL (ref 0.3–1.2)
BUN: 13 mg/dL (ref 6–20)
CALCIUM: 9.4 mg/dL (ref 8.9–10.3)
CO2: 27 mmol/L (ref 22–32)
Chloride: 102 mmol/L (ref 101–111)
Creatinine, Ser: 0.78 mg/dL (ref 0.61–1.24)
GFR calc Af Amer: >60 mL/min (ref >60)
GFR calc non Af Amer: >60 mL/min (ref >60)
GLUCOSE: 131 mg/dL — AB (ref 65–99)
Potassium: 4 mmol/L (ref 3.5–5.1)
SODIUM: 139 mmol/L (ref 135–145)
TOTAL PROTEIN: 7.4 g/dL (ref 6.5–8.1)

## 2015-03-14 LAB — URINE DRUG SCREEN, QUALITATIVE (ARMC ONLY)
Amphetamines, Ur Screen: NOT DETECTED
Barbiturates, Ur Screen: NOT DETECTED
Benzodiazepine, Ur Scrn: POSITIVE — AB
CANNABINOID 50 NG, UR ~~LOC~~: NOT DETECTED
COCAINE METABOLITE, UR ~~LOC~~: NOT DETECTED
MDMA (ECSTASY) UR SCREEN: NOT DETECTED
Methadone Scn, Ur: NOT DETECTED
Opiate, Ur Screen: NOT DETECTED
PHENCYCLIDINE (PCP) UR S: NOT DETECTED
TRICYCLIC, UR SCREEN: POSITIVE — AB

## 2015-03-14 LAB — CBC
HCT: 48.5 % (ref 40.0–52.0)
HEMOGLOBIN: 16 g/dL (ref 13.0–18.0)
MCH: 31.3 pg (ref 26.0–34.0)
MCHC: 33 g/dL (ref 32.0–36.0)
MCV: 95 fL (ref 80.0–100.0)
PLATELETS: 185 10*3/uL (ref 150–440)
RBC: 5.11 MIL/uL (ref 4.40–5.90)
RDW: 12.8 % (ref 11.5–14.5)
WBC: 4.9 10*3/uL (ref 3.8–10.6)

## 2015-03-14 LAB — ETHANOL: Alcohol, Ethyl (B): <5 mg/dL (ref <5)

## 2015-03-14 MED ORDER — DIPHENHYDRAMINE HCL 25 MG PO CAPS: 25.0000 mg | ORAL_CAPSULE | Freq: Four times a day (QID) | ORAL | Status: DC | PRN

## 2015-03-14 MED ORDER — ZOLPIDEM TARTRATE 5 MG PO TABS
5.0000 mg | ORAL_TABLET | Freq: Every day | ORAL | Status: DC
  Administered 2015-03-14: 5 mg via ORAL
  Filled 2015-03-14: qty 1

## 2015-03-14 MED ORDER — PANTOPRAZOLE SODIUM 40 MG PO TBEC
40.0000 mg | DELAYED_RELEASE_TABLET | Freq: Every day | ORAL | Status: DC
  Administered 2015-03-14 – 2015-03-15 (×2): 40 mg via ORAL
  Filled 2015-03-14 (×2): qty 1

## 2015-03-14 MED ORDER — CYCLOBENZAPRINE HCL 10 MG PO TABS: 10.0000 mg | ORAL_TABLET | Freq: Three times a day (TID) | ORAL | Status: DC | PRN

## 2015-03-14 MED ORDER — LURASIDONE HCL 40 MG PO TABS
60.0000 mg | ORAL_TABLET | Freq: Two times a day (BID) | ORAL | Status: DC
  Filled 2015-03-14 (×3): qty 1

## 2015-03-14 MED ORDER — PALIPERIDONE PALMITATE 234 MG/1.5ML IM SUSP: 234.0000 mg | Freq: Once | INTRAMUSCULAR | Status: DC

## 2015-03-14 MED ORDER — HALOPERIDOL 2 MG PO TABS: 2.0000 mg | ORAL_TABLET | Freq: Every day | ORAL | Status: DC | PRN

## 2015-03-14 MED ORDER — DIVALPROEX SODIUM ER 500 MG PO TB24
1000.0000 mg | ORAL_TABLET | Freq: Two times a day (BID) | ORAL | Status: DC
  Administered 2015-03-14 – 2015-03-15 (×3): 1000 mg via ORAL
  Filled 2015-03-14 (×3): qty 2

## 2015-03-14 MED ORDER — LURASIDONE HCL 40 MG PO TABS
60.0000 mg | ORAL_TABLET | Freq: Every day | ORAL | Status: DC
  Filled 2015-03-14: qty 2

## 2015-03-14 MED ORDER — LURASIDONE HCL 40 MG PO TABS
60.0000 mg | ORAL_TABLET | Freq: Two times a day (BID) | ORAL | Status: DC
  Administered 2015-03-15: 60 mg via ORAL
  Filled 2015-03-14 (×3): qty 1

## 2015-03-14 MED ORDER — LORAZEPAM 2 MG PO TABS: 2.0000 mg | ORAL_TABLET | Freq: Four times a day (QID) | ORAL | Status: DC | PRN
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
## 2015-03-14 NOTE — ED Notes (Signed)
Report called to Kindal Ponti RN in ED BHU   Pt to transfer at this time

## 2015-03-14 NOTE — ED Notes (Signed)
Patient resting quietly in room. No noted distress or abnormal behaviors noted. Will continue 15 minute checks and observation by security camera for safety. 

## 2015-03-14 NOTE — ED Notes (Signed)
Pt. Noted in room resting. No complaints or concerns voiced. No distress or abnormal behavior noted. Will continue to monitor with security cameras. Q 15 minute rounds continue. 

## 2015-03-14 NOTE — ED Notes (Signed)
Snack given.

## 2015-03-14 NOTE — ED Notes (Signed)
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
## 2015-03-14 NOTE — BH Assessment (Signed)
Writer informed TTS Rene Kocher(Regina) of the consult.  Writer asked the nurse working with the patient to place the Tele Psych machine in the patient's room.

## 2015-03-14 NOTE — ED Notes (Signed)

## 2015-03-14 NOTE — Clinical Social Work Note (Signed)
CSW spoke with MD to provide disposition of pt remaining in the ED overnight and having psychiatry assess In the morning.  Elray Bubaegina Mads Borgmeyer LCSW  [facility]/Triage/TTS

## 2015-03-14 NOTE — ED Notes (Signed)
BEHAVIORAL HEALTH ROUNDING Patient sleeping: No. Patient alert and oriented: yes Behavior appropriate: Yes.  ; If no, describe:  Nutrition and fluids offered: yes Toileting and hygiene offered: Yes  Sitter present: q15 minute observations and security camera monitoring Law enforcement present: Yes  ODS  
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
## 2015-03-14 NOTE — ED Notes (Signed)
Patient given dinner. He has been calm and cooperative with all nursing interventions. Watching TV in the day area. Maintained on 15 minute checks and observation by security camera for safety.

## 2015-03-14 NOTE — ED Notes (Addendum)
Group home manager came for update on patient. Patient was told about the visit. He was able to discuss some of the reasons for his outbursts in the group home. However, he is having difficulty managing his behaviors even though he takes his medications.  Will continue all safety precautions.
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
## 2015-03-14 NOTE — Clinical Social Work Note (Signed)
CSW spoke with pt's MD to provide disposition.  Per NP pt is to remain in the ED overnight and psychiatry will Evaluate in the AM to see if the IVC will be upheld or overturned.   Elray Bubaegina Josel Keo LCSW [facility]/Triage/TTS

## 2015-03-14 NOTE — ED Notes (Signed)
Pt. Noted in room sleeping;. No complaints or concerns voiced. No distress or abnormal behavior noted. Will continue to monitor with security cameras. Q 15 minute rounds continue. 

## 2015-03-14 NOTE — Clinical Social Work Note (Signed)
CSW left message for pt's legal guardian Cassandra for phone call back to provide collaterals [phone]  [facility] Surgery[facility]ina Shamiya Demeritt LCSW

## 2015-03-14 NOTE — ED Notes (Signed)

## 2015-03-15 DIAGNOSIS — F203 Undifferentiated schizophrenia: Secondary | ICD-10-CM | POA: Diagnosis not present

## 2015-03-15 DIAGNOSIS — F912 Conduct disorder, adolescent-onset type: Secondary | ICD-10-CM | POA: Diagnosis not present

## 2015-03-15 NOTE — ED Notes (Signed)
Patient took a shower. Patient resting quietly in room. No noted distress or abnormal behaviors noted. Will continue 15 minute checks and observation by security camera for safety.

## 2015-03-15 NOTE — ED Notes (Signed)
Patient sitting quietly in day area. Maintained on 15 minute checks and observation by security camera for safety.

## 2015-03-15 NOTE — ED Notes (Signed)
Patient received dinner.  No current complaints. Patient in good behavioral control.Maintained on 15 minute checks and observation by security camera for safety.

## 2015-03-15 NOTE — ED Notes (Signed)
Pt. Noted in room sleeping;. No complaints or concerns voiced. No distress or abnormal behavior noted. Will continue to monitor with security cameras. Q 15 minute rounds continue. 

## 2015-03-15 NOTE — ED Notes (Signed)

## 2015-03-15 NOTE — ED Notes (Signed)
Appetite good at lunch. Patient in no acute distress, no complaints.Maintained on 15 minute checks and observation by security camera for safety.

## 2015-03-15 NOTE — Clinical Social Work Note (Signed)
LCSW called group home [facility] [phone] and [facility] and conveyed patient is ready for pick up. They will come and get him. BHU nurse notified.

## 2015-03-15 NOTE — ED Notes (Signed)
Patient discharged ambulatory back to group home, accompanied by caregiver. He denies SI or HI. Discharge instructions reviewed with patient and caregiver, they verbalize understanding. Patient received copy of DC plan and all personal belongings.

## 2015-03-15 NOTE — Discharge Instructions (Signed)
Schizophrenia °Schizophrenia is a mental illness. It may cause disturbed or disorganized thinking, speech, or behavior. People with schizophrenia have problems functioning in one or more areas of life: work, school, home, or relationships. People with schizophrenia are at increased risk for suicide, certain chronic physical illnesses, and unhealthy behaviors, such as smoking and drug use. °People who have family members with schizophrenia are at higher risk of developing the illness. Schizophrenia affects men and women equally but usually appears at an earlier age (teenage or early adult years) in men.  °SYMPTOMS °The earliest symptoms are often subtle (prodrome) and may go unnoticed until the illness becomes more severe (first-break psychosis). Symptoms of schizophrenia may be continuous or may come and go in severity. Episodes often are triggered by major life events, such as family stress, college, military service, marriage, pregnancy or child birth, divorce, or loss of a loved one. People with schizophrenia may see, hear, or feel things that do not exist (hallucinations). They may have false beliefs in spite of obvious proof to the contrary (delusions). Sometimes speech is incoherent or behavior is odd or withdrawn.  °DIAGNOSIS °Schizophrenia is diagnosed through an assessment by your caregiver. Your caregiver will ask questions about your thoughts, behavior, mood, and ability to function in daily life. Your caregiver may ask questions about your medical history and use of alcohol or drugs, including prescription medication. Your caregiver may also order blood tests and imaging exams. Certain medical conditions and substances can cause symptoms that resemble schizophrenia. Your caregiver may refer you to a mental health specialist for evaluation. There are three major criterion for a diagnosis of schizophrenia: °· Two or more of the following five symptoms are present for a month or longer: °¨ Delusions. Often  the delusions are that you are being attacked, harassed, cheated, persecuted or conspired against (persecutory delusions). °¨ Hallucinations.   °¨ Disorganized speech that does not make sense to others. °¨ Grossly disorganized (confused or unfocused) behavior or extremely overactive or underactive motor activity (catatonia). °¨ Negative symptoms such as bland or blunted emotions (flat affect), loss of will power (avolition), and withdrawal from social contacts (social isolation). °· Level of functioning in one or more major areas of life (work, school, relationships, or self-care) is markedly below the level of functioning before the onset of illness.   °· There are continuous signs of illness (either mild symptoms or decreased level of functioning) for at least 6 months or longer. °TREATMENT  °Schizophrenia is a long-term illness. It is best controlled with continuous treatment rather than treatment only when symptoms occur. The following treatments are used to manage schizophrenia: °· Medication--Medication is the most effective and important form of treatment for schizophrenia. Antipsychotic medications are usually prescribed to help manage schizophrenia. Other types of medication may be added to relieve any symptoms that may occur despite the use of antipsychotic medications. °· Counseling or talk therapy--Individual, group, or family counseling may be helpful in providing education, support, and guidance. Many people with schizophrenia also benefit from social skills and job skills (vocational) training. °A combination of medication and counseling is best for managing the disorder over time. A procedure in which electricity is applied to the brain through the scalp (electroconvulsive therapy) may be used to treat catatonic schizophrenia or schizophrenia in people who cannot take or do not respond to medication and counseling. °  °This information is not intended to replace advice given to you by your health  care provider. Make sure you discuss any questions you have with   your health care provider. °  °Document Released: 03/09/2000 Document Revised: 04/02/2014 Document Reviewed: 06/04/2012 °Elsevier Interactive Patient Education ©2016 [employer] ° °

## 2015-03-15 NOTE — ED Notes (Signed)
Maintained on 15 minute checks and observation by security camera for safety. 

## 2015-03-15 NOTE — ED Notes (Signed)
Patient aware he will be discharged. He is waiting for group home to come pick him up.

## 2015-03-15 NOTE — ED Provider Notes (Signed)
Patient has been cleared for discharge by Dr. Garnetta BuddyFaheem.  [name] FilbertJonathan E Williams, MD 03/15/15 [phone]
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
## 2015-03-15 NOTE — ED Notes (Signed)
Patient received breakfast tray 

## 2015-03-15 NOTE — ED Notes (Signed)
Patient in day area, quietly watching TV. Cooperative with all nursing interventions.  Maintained on 15 minute checks and observation by security camera for safety.

## 2015-04-04 ENCOUNTER — Encounter: Payer: Self-pay | Admitting: *Deleted

## 2015-04-04 ENCOUNTER — Emergency Department
Admission: EM | Admit: 2015-04-04 | Discharge: 2015-04-05 | Disposition: A | Discharge status: Other Acute Inpatient Hospital | Payer: Medicare Other | Attending: Emergency Medicine | Admitting: Emergency Medicine

## 2015-04-04 DIAGNOSIS — R454 Irritability and anger: Secondary | ICD-10-CM

## 2015-04-04 DIAGNOSIS — Y9289 Other specified places as the place of occurrence of the external cause: Secondary | ICD-10-CM | POA: Insufficient documentation

## 2015-04-04 DIAGNOSIS — K219 Gastro-esophageal reflux disease without esophagitis: Secondary | ICD-10-CM

## 2015-04-04 DIAGNOSIS — Y9389 Activity, other specified: Secondary | ICD-10-CM | POA: Diagnosis not present

## 2015-04-04 DIAGNOSIS — Y998 Other external cause status: Secondary | ICD-10-CM | POA: Insufficient documentation

## 2015-04-04 DIAGNOSIS — W2209XA Striking against other stationary object, initial encounter: Secondary | ICD-10-CM | POA: Diagnosis not present

## 2015-04-04 DIAGNOSIS — R45850 Homicidal ideations: Secondary | ICD-10-CM | POA: Insufficient documentation

## 2015-04-04 DIAGNOSIS — Z043 Encounter for examination and observation following other accident: Secondary | ICD-10-CM | POA: Insufficient documentation

## 2015-04-04 DIAGNOSIS — Z79899 Other long term (current) drug therapy: Secondary | ICD-10-CM | POA: Diagnosis not present

## 2015-04-04 DIAGNOSIS — F911 Conduct disorder, childhood-onset type: Secondary | ICD-10-CM | POA: Diagnosis present

## 2015-04-04 DIAGNOSIS — F203 Undifferentiated schizophrenia: Secondary | ICD-10-CM | POA: Diagnosis not present

## 2015-04-04 DIAGNOSIS — F209 Schizophrenia, unspecified: Secondary | ICD-10-CM | POA: Diagnosis present

## 2015-04-04 DIAGNOSIS — F1310 Sedative, hypnotic or anxiolytic abuse, uncomplicated: Secondary | ICD-10-CM | POA: Insufficient documentation

## 2015-04-04 DIAGNOSIS — R4689 Other symptoms and signs involving appearance and behavior: Secondary | ICD-10-CM | POA: Diagnosis present

## 2015-04-04 LAB — CBC
HCT: 45.6 % (ref 40.0–52.0)
Hemoglobin: 15.8 g/dL (ref 13.0–18.0)
MCH: 32.3 pg (ref 26.0–34.0)
MCHC: 34.6 g/dL (ref 32.0–36.0)
MCV: 93.3 fL (ref 80.0–100.0)
PLATELETS: 189 10*3/uL (ref 150–440)
RBC: 4.89 MIL/uL (ref 4.40–5.90)
RDW: 12.9 % (ref 11.5–14.5)
WBC: 7.6 10*3/uL (ref 3.8–10.6)

## 2015-04-04 LAB — COMPREHENSIVE METABOLIC PANEL
ALBUMIN: 4.2 g/dL (ref 3.5–5.0)
ALT: 59 U/L (ref 17–63)
ANION GAP: 8 (ref 5–15)
AST: 36 U/L (ref 15–41)
Alkaline Phosphatase: 61 U/L (ref 38–126)
BILIRUBIN TOTAL: 1 mg/dL (ref 0.3–1.2)
BUN: 11 mg/dL (ref 6–20)
CHLORIDE: 103 mmol/L (ref 101–111)
CO2: 28 mmol/L (ref 22–32)
Calcium: 9.6 mg/dL (ref 8.9–10.3)
Creatinine, Ser: 0.71 mg/dL (ref 0.61–1.24)
GFR calc Af Amer: >60 mL/min (ref >60)
GLUCOSE: 103 mg/dL — AB (ref 65–99)
POTASSIUM: 3.5 mmol/L (ref 3.5–5.1)
Sodium: 139 mmol/L (ref 135–145)
TOTAL PROTEIN: 7.7 g/dL (ref 6.5–8.1)

## 2015-04-04 LAB — SALICYLATE LEVEL: Salicylate Lvl: <4 mg/dL (ref 2.8–30.0)

## 2015-04-04 LAB — URINE DRUG SCREEN, QUALITATIVE (ARMC ONLY)
AMPHETAMINES, UR SCREEN: NOT DETECTED
BENZODIAZEPINE, UR SCRN: POSITIVE — AB
Barbiturates, Ur Screen: NOT DETECTED
Cannabinoid 50 Ng, Ur ~~LOC~~: NOT DETECTED
Cocaine Metabolite,Ur ~~LOC~~: NOT DETECTED
MDMA (ECSTASY) UR SCREEN: NOT DETECTED
METHADONE SCREEN, URINE: NOT DETECTED
Opiate, Ur Screen: NOT DETECTED
Phencyclidine (PCP) Ur S: NOT DETECTED
Tricyclic, Ur Screen: POSITIVE — AB

## 2015-04-04 LAB — ETHANOL

## 2015-04-04 LAB — ACETAMINOPHEN LEVEL
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
## 2015-04-04 NOTE — ED Notes (Signed)
Pt offered meal at this time, pt declined. Will let RN know once he needs something.

## 2015-04-04 NOTE — ED Notes (Signed)
ED BHU PLACEMENT JUSTIFICATION Is the patient under IVC or is there intent for IVC: Yes.   Is the patient medically cleared: Yes.   Is there vacancy in the ED BHU: Yes.   Is the population mix appropriate for patient: Yes.   Is the patient awaiting placement in inpatient or outpatient setting: Yes.   Has the patient had a psychiatric consult: No   Survey of unit performed for contraband, proper placement and condition of furniture, tampering with fixtures in bathroom, shower, and each patient room: Yes.  ; Findings:  APPEARANCE/BEHAVIOR Calm and cooperative. NEURO ASSESSMENT Orientation: oriented x3,denies pain Hallucinations: No.None noted (Hallucinations) Speech: Normal Gait: normal RESPIRATORY ASSESSMENT Respirations even and unlabored noted. CARDIOVASCULAR ASSESSMENT regular rate, pulses equal, skin warm and dry. GASTROINTESTINAL ASSESSMENT No GI complaints at this time. EXTREMITIES Full ROM PLAN OF CARE Provide calm/safe environment. Vital signs assessed twice daily. ED BHU Assessment once each 12-hour shift. Collaborate with intake RN daily or as condition indicates. Assure the ED provider has rounded once each shift. Provide and encourage hygiene. Provide redirection as needed. Assess for escalating behavior; address immediately and inform ED provider.  Assess family dynamic and appropriateness for visitation as needed: Yes.  ; If necessary, describe findings:  Educate the patient/family about BHU procedures/visitation: Yes.  ; If necessary, describe findings:  
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
## 2015-04-04 NOTE — BH Assessment (Signed)
Assessment Note  Enoc Getter is an 30 y.o. male. Mr. Toothman arrived to the ED with [facility] police.  He states that he broke the bottom part of the closet door. He states that he was yelling and fussing at the staff. He states "I started to hit him, but I didn't". He states he was upset due to him being uncomfortable being there.  He has been at this placement for 3 months.  He denied symptoms of depression or anxiety. He denied having auditory or visual hallucinations.  He denied suicidal and homicidal ideations or intent. He states that he is feeling a little agitated, but is calm now. IVC paperwork states that Mr. Kawabata is a resident at [facility]. Tonight he tore a door down and broke it into two pieces.  He began swinging the top half of the door at one of the staff members.  He also banged his head on the wall and said that he wants to die.  Additionally, he made threats to other residents and staff members.  TTS spoke with [facility] staff nurse, Quay Burow [phone], she reports the holidays are rough for him, especially when other residents go to see their families.  He really wants to be home. He did not get his money today. He had previously broke the door, and half was left hanging.  He broke that part off and charged at staff. He put his head through the wall, from banging it. He stated that he wants to die, kill staff, and hurt others in the home.  He has previous [facility] admissions.  He thought he was going to get some money today, but due to weather, the individual was unable to give him money today. He became upset and agitated as it got later in the day. Staff heard him hit the door, and that's when he charged the staff with the piece of the door. He has History of schizophrenia and bipolar affeactive disorder.  Diagnosis:   Past Medical History:  Past Medical History  Diagnosis Date  . Fatty liver   . Schizophrenia (HCC)   . Depression   . Anxiety    . Bipolar affective disorder (HCC)     History reviewed. No pertinent past surgical history.  Family History: No family history on file.  Social History:  reports that he has never smoked. He has never used smokeless tobacco. He reports that he does not drink alcohol or use illicit drugs.  Additional Social History:  Alcohol / Drug Use History of alcohol / drug use?: No history of alcohol / drug abuse  CIWA: CIWA-Ar BP: 137/78 mmHg Pulse Rate: (!) 116 COWS:    Allergies:  Allergies  Allergen Reactions  . Ibuprofen     GI distress   . Zoloft [Sertraline Hcl]     Pt reports increased irritability, hallucinations when taking medication    Home Medications:  (Not in a hospital admission)  OB/GYN Status:  No LMP for male patient.  General Assessment Data Location of Assessment: [facility] ED TTS Assessment: In system Is this a Tele or Face-to-Face Assessment?: Face-to-Face Is this an Initial Assessment or a Re-assessment for this encounter?: Initial Assessment Marital status: Single Maiden name: n/a Is patient pregnant?: No Pregnancy Status: No Living Arrangements: Group Home ([facility] [phone]) Can pt return to current living arrangement?: Yes Admission Status: Involuntary Is patient capable of signing voluntary admission?: No Referral Source: Self/Family/Friend Insurance type: Medicaid/Medicare  Medical Screening Exam [facility] Walk-in  ONLY) Medical Exam completed: Yes  Crisis Care Plan Living Arrangements: Group Home ([facility] - [phone]) Legal Guardian: Other: ([employer] guardianship services [phone] ) Name of Psychiatrist:  (Unsure of the name - new psychiatrist) Name of Therapist:  (Unknown)  Education Status Is patient currently in school?: No Current Grade: n/a Highest grade of school patient has completed: 12th Name of school: [employer] Contact person: n/a  Risk to self with the past 6 months Suicidal Ideation:  No Has patient been a risk to self within the past 6 months prior to admission? : No Suicidal Intent: No Has patient had any suicidal intent within the past 6 months prior to admission? : No Is patient at risk for suicide?: No Suicidal Plan?: No Has patient had any suicidal plan within the past 6 months prior to admission? : No Access to Means: No What has been your use of drugs/alcohol within the last 12 months?: Denied usage Previous Attempts/Gestures: No How many times?: 0 Other Self Harm Risks: denied Triggers for Past Attempts: None known Intentional Self Injurious Behavior:  (Banging head on wall today) Family Suicide History: No Recent stressful life event(s): Conflict (Comment) (arguement with staff) Persecutory voices/beliefs?: No Depression: No Depression Symptoms:  (Denied) Substance abuse history and/or treatment for substance abuse?: No Suicide prevention information given to non-admitted patients: Not applicable  Risk to Others within the past 6 months Homicidal Ideation: No Does patient have any lifetime risk of violence toward others beyond the six months prior to admission? : Yes (comment) (Aggressive behaviors towards staff and family) Thoughts of Harm to Others: No-Not Currently Present/Within Last 6 Months (Denied current thougts to harm others) Current Homicidal Intent: No Current Homicidal Plan: No Access to Homicidal Means: No Identified Victim: None identified History of harm to others?: Yes Assessment of Violence: On admission Violent Behavior Description: aggression towards self and staff, calm upon arrival at hospital Does patient have access to weapons?: No Criminal Charges Pending?: No Does patient have a court date: No Is patient on probation?: No  Psychosis Hallucinations: None noted Delusions: None noted  Mental Status Report Appearance/Hygiene: In scrubs, Unremarkable Eye Contact: Poor Motor Activity: Unremarkable Speech:  Logical/coherent Level of Consciousness: Alert Mood: Euthymic Affect: Appropriate to circumstance Anxiety Level: Minimal Thought Processes: Coherent Judgement: Partial Orientation: Person, Place, Time, Situation Obsessive Compulsive Thoughts/Behaviors: None  Cognitive Functioning Concentration: Normal Memory: Recent Intact IQ: Below Average Insight: Fair Impulse Control: Poor Appetite: Good Sleep: No Change Vegetative Symptoms: None  ADLScreening [facility] Assessment Services) Patient's cognitive ability adequate to safely complete daily activities?: Yes Patient able to express need for assistance with ADLs?: Yes Independently performs ADLs?: Yes (appropriate for developmental age)  Prior Inpatient Therapy Prior Inpatient Therapy: Yes Prior Therapy Dates: 2016,2015, [facility],  Reason for Treatment: schizophrenia, bipolar  Prior Outpatient Therapy Prior Outpatient Therapy: Yes Prior Therapy Dates: Current Prior Therapy Facilty/Provider(s): Due to insurance changes, a new therapist was assigned Reason for Treatment: Bipolar, Schizophrenia Does patient have an ACCT team?: No Does patient have Intensive In-House Services?  : No Does patient have [facility] services? : No Does patient have P4CC services?: No  ADL Screening (condition at time of admission) Patient's cognitive ability adequate to safely complete daily activities?: Yes Patient able to express need for assistance with ADLs?: Yes Independently performs ADLs?: Yes (appropriate for developmental age)       Abuse/Neglect Assessment (Assessment to be complete while patient is alone) Physical Abuse: Denies Verbal Abuse: Denies  Sexual Abuse: Denies Exploitation of patient/patient's resources: Denies Self-Neglect: Denies Values / Beliefs Cultural Requests During Hospitalization: None Spiritual Requests During Hospitalization: None   Advance Directives (For  Healthcare) Does patient have an advance directive?: No Would patient like information on creating an advanced directive?: No - patient declined information    Additional Information 1:1 In Past 12 Months?: No CIRT Risk: No Elopement Risk: No Does patient have medical clearance?: Yes     Disposition:  Disposition Initial Assessment Completed for this Encounter: Yes Disposition of Patient: Other dispositions Other disposition(s):  (To be seen by the psychiatrist)  On Site Evaluation by:   Reviewed with Physician:    Justice Deeds 04/04/2015 9:35 PM

## 2015-04-04 NOTE — ED Notes (Signed)
CSW CraigmontKiesha at bedside at this time

## 2015-04-04 NOTE — ED Notes (Signed)
MD Quigley at bedside. 

## 2015-04-04 NOTE — ED Notes (Signed)
Patient assigned to appropriate care area based on presenting need for behavioral health evaluation. Patient oriented to unit/care area by nursing staff. Patient informed that care areas are designed for safety and monitored by security cameras at all times in order to promote and sure safety for both them and the staff assigned to their care. Visiting hours, phone use, daily routines, meal/snack schedule explained in detail to patient. Patient verbalized understanding of the instructions and information provided to them by nursing staff and was given the opportunity to ask questions related to their individualized plan of care as it stands at this time. Patient provided a verbal contract for safety to this RN and agrees to promptly notify a staff member should any changes occur that would lead to them experiencing thoughts of harming themselves or anyone else. 

## 2015-04-04 NOTE — ED Notes (Signed)
Pt ambulatory to BHU at this time via ODS officer and ED tech [place]. Pt calm and cooperative, no acute distress noted.

## 2015-04-05 DIAGNOSIS — K219 Gastro-esophageal reflux disease without esophagitis: Secondary | ICD-10-CM

## 2015-04-05 DIAGNOSIS — F203 Undifferentiated schizophrenia: Secondary | ICD-10-CM

## 2015-04-05 DIAGNOSIS — R454 Irritability and anger: Secondary | ICD-10-CM | POA: Diagnosis not present

## 2015-04-05 MED ORDER — PANTOPRAZOLE SODIUM 40 MG PO TBEC: 40.0000 mg | DELAYED_RELEASE_TABLET | Freq: Every day | ORAL | Status: DC

## 2015-04-05 MED ORDER — CYCLOBENZAPRINE HCL 10 MG PO TABS
10.0000 mg | ORAL_TABLET | Freq: Three times a day (TID) | ORAL | Status: DC | PRN
  Administered 2015-04-05: 10 mg via ORAL

## 2015-04-05 MED ORDER — DIVALPROEX SODIUM ER 500 MG PO TB24
1000.0000 mg | ORAL_TABLET | Freq: Two times a day (BID) | ORAL | Status: DC
  Administered 2015-04-05: 1000 mg via ORAL

## 2015-04-05 MED ORDER — PANTOPRAZOLE SODIUM 40 MG PO TBEC
40.0000 mg | DELAYED_RELEASE_TABLET | Freq: Once | ORAL | Status: AC
  Administered 2015-04-05: 40 mg via ORAL

## 2015-04-05 MED ORDER — HALOPERIDOL 5 MG PO TABS
5.0000 mg | ORAL_TABLET | Freq: Three times a day (TID) | ORAL | Status: DC
  Administered 2015-04-05 (×2): 5 mg via ORAL
  Filled 2015-04-05: qty 1

## 2015-04-05 MED ORDER — LORAZEPAM 2 MG PO TABS: 2.0000 mg | ORAL_TABLET | Freq: Four times a day (QID) | ORAL | Status: DC | PRN

## 2015-04-05 MED ORDER — DIPHENHYDRAMINE HCL 25 MG PO CAPS: 25.0000 mg | ORAL_CAPSULE | Freq: Four times a day (QID) | ORAL | Status: DC | PRN

## 2015-04-05 MED ORDER — PANTOPRAZOLE SODIUM 40 MG PO TBEC: 40.0000 mg | DELAYED_RELEASE_TABLET | Freq: Every day | ORAL | Status: AC

## 2015-04-05 MED ORDER — HALOPERIDOL 5 MG PO TABS
ORAL_TABLET | ORAL | Status: AC
  Administered 2015-04-05: 5 mg via ORAL
  Filled 2015-04-05: qty 1

## 2015-04-05 MED ORDER — PANTOPRAZOLE SODIUM 40 MG PO TBEC
DELAYED_RELEASE_TABLET | ORAL | Status: AC
  Administered 2015-04-05: 40 mg via ORAL
  Filled 2015-04-05: qty 1

## 2015-04-05 MED ORDER — DIVALPROEX SODIUM ER 500 MG PO TB24
ORAL_TABLET | ORAL | Status: AC
Start: 2015-04-05 — End: 2015-04-05
  Administered 2015-04-05: 1000 mg via ORAL
  Filled 2015-04-05: qty 2

## 2015-04-05 MED ORDER — CYCLOBENZAPRINE HCL 10 MG PO TABS
ORAL_TABLET | ORAL | Status: AC
  Administered 2015-04-05: 10 mg via ORAL
  Filled 2015-04-05: qty 1

## 2015-04-05 NOTE — ED Notes (Signed)
Pt resting comfortably in bed with lights and TV turned off. NAD noted at this time. Will continue Q 15 minute monitoring. Respirations even and unlabored.

## 2015-04-05 NOTE — ED Notes (Signed)
Pt resting comfortably with lights and TV turned off. No increased work in breathing noted. Will continue with q 15 min monitoring. NAD noted at this time.

## 2015-04-05 NOTE — ED Notes (Signed)
ENVIRONMENTAL ASSESSMENT Potentially harmful objects out of patient reach: Yes Personal belongings secured: Yes Patient dressed in hospital provided attire only: Yes Plastic bags out of patient reach: Yes Patient care equipment (cords, cables, call bells, lines, and drains) shortened, removed, or accounted for: Yes Equipment and supplies removed from bottom of stretcher: Yes Potentially toxic materials out of patient reach: Yes Sharps container removed or out of patient reach: Yes  Patient in bed resting. No signs of distress shown. Maintained on 15 minute checks and observation by security camera for safety.

## 2015-04-05 NOTE — ED Notes (Signed)
Patient resting quietly in room. No noted distress or abnormal behaviors noted. Will continue 15 minute checks and observation by security camera for safety. 

## 2015-04-05 NOTE — ED Notes (Signed)
Patient is resting. No signs of distress noted. Maintained on 15 minute checks and observation by security camera for safety.

## 2015-04-05 NOTE — ED Provider Notes (Signed)
-----------------------------------------   6:45 AM on 04/05/2015 -----------------------------------------   Blood pressure 125/67, pulse 92, temperature 98.1 F (36.7 C), temperature source Oral, resp. rate 18, height 5\' 9"  (1.753 m), weight 218 lb (98.884 kg), SpO2 100 %.  The patient had no acute events since last update.  Calm and cooperative at this time.  Disposition is pending per Psychiatry/Behavioral Medicine team recommendations.     Rebecka ApleyAllison P Lexxie Winberg, MD 04/05/15 [id number]

## 2015-04-05 NOTE — ED Notes (Signed)
Patient denies Si/HI/AVH and states he has pain rated a 5/10 in his neck. Patient says he feels "better" and more calm since yesterday. Will continue to monitor. Maintained on 15 minute checks and observation by security camera for safety.

## 2015-04-05 NOTE — Discharge Instructions (Signed)
Anger Management °Anger is a normal human emotion. However, anger can range from mild irritation to rage. When your anger becomes harmful to yourself or others, it is unhealthy anger.  °CAUSES  °There are many reasons for unhealthy anger. Many people learn how to express anger from observing how their family expressed anger. In troubled, chaotic, or abusive families, anger can be expressed as rage or even violence. Children can grow up never learning how healthy anger can be expressed. Factors that contribute to unhealthy anger include:  °· Drug or alcohol abuse. °· Post-traumatic stress disorder. °· Traumatic brain injury. °COMPLICATIONS  °People with unhealthy anger tend to overreact and retaliate against a real or imagined threat. The need to retaliate can turn into violence or verbal abuse against another person. Chronic anger can lead to health problems, such as hypertension, high blood pressure, and depression. °TREATMENT  °Exercising, relaxing, meditating, or writing out your feelings all can be beneficial in managing moderate anger. For unhealthy anger, the following methods may be used: °· Cognitive-behavioral counseling (learning skills to change the thoughts that influence your mood). °· Relaxation training. °· Interpersonal counseling. °· Assertive communication skills. °· Medication. °  °This information is not intended to replace advice given to you by your health care provider. Make sure you discuss any questions you have with your health care provider. °  °Document Released: 01/07/2007 Document Revised: 06/04/2011 Document Reviewed: 05/18/2010 °Elsevier Interactive Patient Education ©2016 [employer]vier Inc. ° °

## 2015-04-05 NOTE — ED Notes (Addendum)
Patient currently denies SI/HI/AVH and pain. Discharge instructions reviewed with patient. All belongings returned to patient. Patient discharged to group home.

## 2015-04-05 NOTE — ED Provider Notes (Signed)
-----------------------------------------   1:09 PM on 04/05/2015 -----------------------------------------  Patient has been seen and evaluated by psychiatry, they believe the patient is safe for discharge at this time, with outpatient follow-up.  Minna AntisKevin Cathey Fredenburg, MD 04/05/15 1309

## 2015-04-05 NOTE — ED Notes (Signed)
Patient is resting quietly in room. No complaints noted. Maintained on 15 minute checks and observation by security camera for safety.

## 2015-04-05 NOTE — Progress Notes (Signed)
TTS has contacted the pts group home [facility] @ [phone] as well as the pts guardian ([employer] [facility] [phone]@ [phone]) both parties were made aware that the pt has been assessed and that recommendations have been made that the pt be discharged back into the care of the group home. Group home representative was given updated clinical information and have agreed to transport the client prior to 7pm.   04/05/2015 Cheryl FlashNicole Dmitry Macomber, MS, NCC, LPCA Therapeutic Triage Specialist

## 2015-04-05 NOTE — Progress Notes (Signed)
Called Group home to clarify patient's Depakote order. [facility]  [phone]  Patient is on Depakote ER 24 hr  1000mg  po bid per Group home (Extended-Release vs Delayed release)  Bari MantisKristin Wenzel Backlund PharmD Clinical Pharmacist 04/05/2015 9:28 AM

## 2015-04-05 NOTE — Consult Note (Signed)
[facility] Consult   Reason for Consult:  Consult for this 30 year old man with a history of schizophrenia who was brought here from his group home after becoming aggressive during an argument Referring Physician:  Corky Downs Patient Identification: [name] [name] MRN:  [id number] Principal Diagnosis: Schizophrenia [facility]) Diagnosis:   Patient Active Problem List   Diagnosis Date Noted  . Gastric reflux syndrome [K21.9] 04/05/2015  . Undifferentiated schizophrenia ([place]) [F20.3]   . Schizophrenia ([place]) [F20.9] 01/18/2015  . Aggressive behavior [F60.89] 11/25/2013  . Adjustment disorder with mixed disturbance of emotions and conduct [F43.25] 10/19/2013    Total Time spent with patient: 1 hour  Subjective:   [name] [name] is a 30 y.o. male patient admitted with "I got real mad".  HPI:  Patient interviewed. Chart reviewed. Intake notes read, old chart and notes reviewed, currently labs and vitals reviewed. 30 year old man lives at a group home and says that yesterday in the afternoon he got in an argument with a staff member because snack time was being changed. The patient says that he thinks that that particular staff person speaks poorly to him and tends to get him irritated anyway. Patient admits that he lost his temper and kicked a door and broke a piece off of it and then raised his hand and raised a piece of broken lumbar as though to hit the staff person but says he did not actually strike them. He continued to be angry and kicked another door later on. Patient did not do anything to intentionally try to hurt himself. He tells me that his mood overall has been a little better than it used to be but he still looses his temper from time to time. People speaking disrespectfully to him or to other clients sets him off. He says his sleeping pattern is uncomfortable. He doesn't have major physical complaints except that his gastric reflux has been worse since running out of his  proton pump inhibitor. Patient complains that he dislikes his current group home and would like to move somewhere else and finds the whole thing frustrating. Day-to-day life is boring. Has very little to do. His hallucinations have been under pretty good control for at least the last couple weeks. He says he's been compliant with all of his prescribed medicines and has not been abusing drugs or alcohol.  Social history: Patient does have a legal guardian who is not a family member. The patient lives in a group home. Finds it frustrating. Doesn't do very much during the day although he can name several realistic productive things that he would like to be doing.  Medical history: He has gastric reflux symptoms. Patient says he has some chronic neck pain for which she takes Flexeril. No other clear ongoing medical problems.  Substance abuse history: He denies any current alcohol or abuse of any drugs and denies any past substance abuse problems.  Past Psychiatric History: Patient states he's had mental health problems since he was a child. He's had several prior hospitalizations but he hasn't been in a hospital in years. He's been to the ER recently but it was a similar circumstance and he was discharged home. He does not have a history of suicide. He has gotten into fights in the past. He is currently being maintained on injectable antipsychotics. Diagnosis of schizophrenia  Risk to Self: Suicidal Ideation: No Suicidal Intent: No Is patient at risk for suicide?: No Suicidal Plan?: No Access to Means: No What has been your use of drugs/alcohol  within the last 12 months?: Denied usage How many times?: 0 Other Self Harm Risks: denied Triggers for Past Attempts: None known Intentional Self Injurious Behavior:  (Banging head on wall today) Risk to Others: Homicidal Ideation: No Thoughts of Harm to Others: No-Not Currently Present/Within Last 6 Months (Denied current thougts to harm others) Current  Homicidal Intent: No Current Homicidal Plan: No Access to Homicidal Means: No Identified Victim: None identified History of harm to others?: Yes Assessment of Violence: On admission Violent Behavior Description: aggression towards self and staff, calm upon arrival at hospital Does patient have access to weapons?: No Criminal Charges Pending?: No Does patient have a court date: No Prior Inpatient Therapy: Prior Inpatient Therapy: Yes Prior Therapy Dates: 2016,2015, Prior Therapy Facilty/Provider(s): [place], [place], [facility] Cella,  Reason for Treatment: schizophrenia, bipolar Prior Outpatient Therapy: Prior Outpatient Therapy: Yes Prior Therapy Dates: Current Prior Therapy Facilty/Provider(s): Due to insurance changes, a new therapist was assigned Reason for Treatment: Bipolar, Schizophrenia Does patient have an ACCT team?: No Does patient have Intensive In-House Services?  : No Does patient have [facility] services? : No Does patient have P4CC services?: No  Past Medical History:  Past Medical History  Diagnosis Date  . Fatty liver   . Schizophrenia ([place])   . Depression   . Anxiety   . Bipolar affective disorder ([place])    History reviewed. No pertinent past surgical history. Family History: No family history on file. Family Psychiatric  History: Patient does not know of any family history of mental health or substance abuse issues Social History:  History  Alcohol Use No     History  Drug Use No    Social History   Social History  . Marital Status: Single    Spouse Name: N/A  . Number of Children: N/A  . Years of Education: N/A   Social History Main Topics  . Smoking status: Never Smoker   . Smokeless tobacco: Never Used  . Alcohol Use: No  . Drug Use: No  . Sexual Activity: Not Asked   Other Topics Concern  . None   Social History Narrative   Additional Social History:    History of alcohol / drug use?: No history of alcohol / drug abuse                      Allergies:   Allergies  Allergen Reactions  . Ibuprofen Other (See Comments)    GI distress   . Zoloft [Sertraline Hcl] Other (See Comments)    Pt reports increased irritability, hallucinations when taking medication    Labs:  Results for orders placed or performed during the hospital encounter of 04/04/15 (from the past 48 hour(s))  Comprehensive metabolic panel     Status: Abnormal   Collection Time: 04/04/15  8:04 PM  Result Value Ref Range   Sodium 139 135 - 145 mmol/L   Potassium 3.5 3.5 - 5.1 mmol/L   Chloride 103 101 - 111 mmol/L   CO2 28 22 - 32 mmol/L   Glucose, Bld 103 (H) 65 - 99 mg/dL   BUN 11 6 - 20 mg/dL   Creatinine, Ser 0.71 0.61 - 1.24 mg/dL   Calcium 9.6 8.9 - 10.3 mg/dL   Total Protein 7.7 6.5 - 8.1 g/dL   Albumin 4.2 3.5 - 5.0 g/dL   AST 36 15 - 41 U/L   ALT 59 17 - 63 U/L   Alkaline Phosphatase 61 38 - 126 U/L   Total  Bilirubin 1.0 0.3 - 1.2 mg/dL   GFR calc non Af Amer >60 >60 mL/min   GFR calc Af Amer >60 >60 mL/min    Comment: (NOTE) The eGFR has been calculated using the CKD EPI equation. This calculation has not been validated in all clinical situations. eGFR's persistently <60 mL/min signify possible Chronic Kidney Disease.    Anion gap 8 5 - 15  Ethanol (ETOH)     Status: None   Collection Time: 04/04/15  8:04 PM  Result Value Ref Range   Alcohol, Ethyl (B) <5 <5 mg/dL    Comment:        LOWEST DETECTABLE LIMIT FOR SERUM ALCOHOL IS 5 mg/dL FOR MEDICAL PURPOSES ONLY   Salicylate level     Status: None   Collection Time: 04/04/15  8:04 PM  Result Value Ref Range   Salicylate Lvl <6.1 2.8 - 30.0 mg/dL  Acetaminophen level     Status: Abnormal   Collection Time: 04/04/15  8:04 PM  Result Value Ref Range   Acetaminophen (Tylenol), Serum <10 (L) 10 - 30 ug/mL    Comment:        THERAPEUTIC CONCENTRATIONS VARY SIGNIFICANTLY. A RANGE OF 10-30 ug/mL MAY BE AN EFFECTIVE CONCENTRATION FOR MANY PATIENTS. HOWEVER, SOME ARE BEST  TREATED AT CONCENTRATIONS OUTSIDE THIS RANGE. ACETAMINOPHEN CONCENTRATIONS >150 ug/mL AT 4 HOURS AFTER INGESTION AND >50 ug/mL AT 12 HOURS AFTER INGESTION ARE OFTEN ASSOCIATED WITH TOXIC REACTIONS.   CBC     Status: None   Collection Time: 04/04/15  8:04 PM  Result Value Ref Range   WBC 7.6 3.8 - 10.6 K/uL   RBC 4.89 4.40 - 5.90 MIL/uL   Hemoglobin 15.8 13.0 - 18.0 g/dL   HCT 45.6 40.0 - 52.0 %   MCV 93.3 80.0 - 100.0 fL   MCH 32.3 26.0 - 34.0 pg   MCHC 34.6 32.0 - 36.0 g/dL   RDW 12.9 11.5 - 14.5 %   Platelets 189 150 - 440 K/uL  Urine Drug Screen, Qualitative ([facility] only)     Status: Abnormal   Collection Time: 04/04/15  8:04 PM  Result Value Ref Range   Tricyclic, Ur Screen POSITIVE (A) NONE DETECTED   Amphetamines, Ur Screen NONE DETECTED NONE DETECTED   MDMA (Ecstasy)Ur Screen NONE DETECTED NONE DETECTED   Cocaine Metabolite,Ur [place] NONE DETECTED NONE DETECTED   Opiate, Ur Screen NONE DETECTED NONE DETECTED   Phencyclidine (PCP) Ur S NONE DETECTED NONE DETECTED   Cannabinoid 50 Ng, Ur [place] NONE DETECTED NONE DETECTED   Barbiturates, Ur Screen NONE DETECTED NONE DETECTED   Benzodiazepine, Ur Scrn POSITIVE (A) NONE DETECTED   Methadone Scn, Ur NONE DETECTED NONE DETECTED    Comment: (NOTE) 950  Tricyclics, urine               Cutoff 1000 ng/mL 200  Amphetamines, urine             Cutoff 1000 ng/mL 300  MDMA (Ecstasy), urine           Cutoff 500 ng/mL 400  Cocaine Metabolite, urine       Cutoff 300 ng/mL 500  Opiate, urine                   Cutoff 300 ng/mL 600  Phencyclidine (PCP), urine      Cutoff 25 ng/mL 700  Cannabinoid, urine              Cutoff 50 ng/mL 800  Barbiturates,  urine             Cutoff 200 ng/mL 900  Benzodiazepine, urine           Cutoff 200 ng/mL 1000 Methadone, urine                Cutoff 300 ng/mL 1100 1200 The urine drug screen provides only a preliminary, unconfirmed 1300 analytical test result and should not be used for non-medical 1400  purposes. Clinical consideration and professional judgment should 1500 be applied to any positive drug screen result due to possible 1600 interfering substances. A more specific alternate chemical method 1700 must be used in order to obtain a confirmed analytical result.  1800 Gas chromato graphy / mass spectrometry (GC/MS) is the preferred 1900 confirmatory method.     Current Facility-Administered Medications  Medication Dose Route Frequency Provider Last Rate Last Dose  . cyclobenzaprine (FLEXERIL) tablet 10 mg  10 mg Oral TID PRN Harvest Dark, MD   10 mg at 04/05/15 0932  . diphenhydrAMINE (BENADRYL) capsule 25 mg  25 mg Oral Q6H PRN Harvest Dark, MD      . divalproex (DEPAKOTE ER) 24 hr tablet 1,000 mg  1,000 mg Oral BID Harvest Dark, MD   1,000 mg at 04/05/15 0934  . haloperidol (HALDOL) tablet 5 mg  5 mg Oral TID Harvest Dark, MD   5 mg at 04/05/15 0933  . LORazepam (ATIVAN) tablet 2 mg  2 mg Oral Q6H PRN Harvest Dark, MD       Current Outpatient Prescriptions  Medication Sig Dispense Refill  . cyclobenzaprine (FLEXERIL) 10 MG tablet Take 10 mg by mouth 3 (three) times daily as needed for muscle spasms.    . diphenhydrAMINE (BENADRYL) 25 mg capsule Take 25 mg by mouth every 6 (six) hours as needed for allergies or sleep.    . divalproex (DEPAKOTE ER) 500 MG 24 hr tablet Take 1,000 mg by mouth 2 (two) times daily.    . haloperidol (HALDOL) 5 MG tablet Take 5 mg by mouth 3 (three) times daily.    Marland Kitchen LORazepam (ATIVAN) 2 MG tablet Take 2 mg by mouth every 6 (six) hours as needed (for agitation).    . paliperidone (INVEGA SUSTENNA) 234 MG/1.5ML SUSP injection Inject 234 mg into the muscle every 28 (twenty-eight) days.    Marland Kitchen zolpidem (AMBIEN) 5 MG tablet Take 5 mg by mouth at bedtime.    . pantoprazole (PROTONIX) 40 MG tablet Take 1 tablet (40 mg total) by mouth daily. 30 tablet 0    Musculoskeletal: Strength & Muscle Tone: within normal limits Gait & Station:  normal Patient leans: N/A  Psychiatric Specialty Exam: Review of Systems  Constitutional: Negative.   HENT: Negative.   Eyes: Negative.   Respiratory: Negative.   Cardiovascular: Negative.   Gastrointestinal: Positive for heartburn.  Musculoskeletal: Negative.   Skin: Negative.   Neurological: Negative.   Psychiatric/Behavioral: Negative for depression, suicidal ideas, hallucinations, memory loss and substance abuse. The patient is nervous/anxious. The patient does not have insomnia.     Blood pressure 106/67, pulse 84, temperature 97.5 F (36.4 C), temperature source Oral, resp. rate 18, height 5' 9"  (1.753 m), weight 98.884 kg (218 lb), SpO2 96 %.Body mass index is 32.18 kg/(m^2).  General Appearance: Bizarre  Eye Contact::  Minimal  Speech:  Slow  Volume:  Decreased  Mood:  Dysphoric  Affect:  Flat  Thought Process:  Linear  Orientation:  Full (Time, Place, and Person)  Thought Content:  Negative  Suicidal Thoughts:  No  Homicidal Thoughts:  No  Memory:  Immediate;   Good Recent;   Good Remote;   Fair  Judgement:  Fair  Insight:  Fair  Psychomotor Activity:  Decreased  Concentration:  Fair  Recall:  [employer] of Knowledge:Fair  Language: Fair  Akathisia:  No  Handed:  Right  AIMS (if indicated):     Assets:  Communication Skills Desire for Improvement Financial Resources/Insurance Housing Physical Health Resilience Social Support  ADL's:  Intact  Cognition: WNL  Sleep:      Treatment Plan Summary: Medication management and Plan Patient with schizophrenia who became aggressive yesterday. He has been calm and cooperative since being in the emergency room here. Patient was cooperative and participation in the interview with me. Expressed some remorse for his behavior. Denied any intention or plan to harm anyone currently. Patient physically appears to be in reasonably good health. Labs unremarkable except for very slight elevation of his glucose. At this point  patient does not appear to be acutely at risk to himself or others and appears to be at his baseline in terms of mental health issues without active psychotic symptoms. The aggression appears to of calm down and been largely situational. Schizophrenia appears to be appropriately treated although the patient certainly benefit from having better psychosocial treatment. His reflux symptoms are little bit worse and I am agreeable to giving him a prescription for his protonic's to take with him at discharge. Patient can be released from the emergency room. Discontinue involuntary commitment. Follow-up with local mental health.  Disposition: Patient does not meet criteria for psychiatric inpatient admission. Supportive therapy provided about ongoing stressors.  Gari Hartsell 04/05/2015 12:15 PM

## 2015-04-05 NOTE — ED Notes (Signed)
Patient in dayroom watching television.  No noted distress or abnormal behaviors noted. Will continue 15 minute checks and observation by security camera for safety.

## 2015-04-05 NOTE — ED Notes (Signed)
Patient currently speaking with physician. No signs of distress noted. Maintained on 15 minute checks and observation by security camera for safety.

## 2015-04-05 NOTE — ED Notes (Signed)
Patient calm and cooperative. Does not show signs of distress nor has any complaints. Awaiting pick up from group home. Maintained on 15 minute checks and observation by security camera for safety.

## 2015-04-11 ENCOUNTER — Emergency Department
Admission: EM | Admit: 2015-04-11 | Discharge: 2015-04-11 | Disposition: A | Discharge status: Home / Self Care | Payer: Medicare Other | Attending: Emergency Medicine | Admitting: Emergency Medicine

## 2015-04-11 ENCOUNTER — Encounter: Payer: Self-pay | Admitting: *Deleted

## 2015-04-11 DIAGNOSIS — R45851 Suicidal ideations: Secondary | ICD-10-CM | POA: Insufficient documentation

## 2015-04-11 DIAGNOSIS — F209 Schizophrenia, unspecified: Secondary | ICD-10-CM | POA: Diagnosis present

## 2015-04-11 DIAGNOSIS — Z79899 Other long term (current) drug therapy: Secondary | ICD-10-CM | POA: Diagnosis not present

## 2015-04-11 DIAGNOSIS — F911 Conduct disorder, childhood-onset type: Secondary | ICD-10-CM | POA: Insufficient documentation

## 2015-04-11 DIAGNOSIS — F319 Bipolar disorder, unspecified: Secondary | ICD-10-CM | POA: Insufficient documentation

## 2015-04-11 DIAGNOSIS — K219 Gastro-esophageal reflux disease without esophagitis: Secondary | ICD-10-CM | POA: Diagnosis present

## 2015-04-11 DIAGNOSIS — F913 Oppositional defiant disorder: Secondary | ICD-10-CM | POA: Insufficient documentation

## 2015-04-11 DIAGNOSIS — F919 Conduct disorder, unspecified: Secondary | ICD-10-CM | POA: Diagnosis present

## 2015-04-11 DIAGNOSIS — F4325 Adjustment disorder with mixed disturbance of emotions and conduct: Secondary | ICD-10-CM | POA: Diagnosis present

## 2015-04-11 DIAGNOSIS — R4689 Other symptoms and signs involving appearance and behavior: Secondary | ICD-10-CM | POA: Diagnosis present

## 2015-04-11 LAB — CBC
HEMATOCRIT: 46.7 % (ref 40.0–52.0)
HEMOGLOBIN: 15.6 g/dL (ref 13.0–18.0)
MCH: 31.1 pg (ref 26.0–34.0)
MCHC: 33.3 g/dL (ref 32.0–36.0)
MCV: 93.5 fL (ref 80.0–100.0)
Platelets: 207 10*3/uL (ref 150–440)
RBC: 5 MIL/uL (ref 4.40–5.90)
RDW: 12.9 % (ref 11.5–14.5)
WBC: 6.3 10*3/uL (ref 3.8–10.6)

## 2015-04-11 LAB — COMPREHENSIVE METABOLIC PANEL
ALBUMIN: 4 g/dL (ref 3.5–5.0)
ALT: 49 U/L (ref 17–63)
ANION GAP: 9 (ref 5–15)
AST: 27 U/L (ref 15–41)
Alkaline Phosphatase: 57 U/L (ref 38–126)
BUN: 10 mg/dL (ref 6–20)
CHLORIDE: 107 mmol/L (ref 101–111)
CO2: 25 mmol/L (ref 22–32)
Calcium: 9.1 mg/dL (ref 8.9–10.3)
Creatinine, Ser: 0.65 mg/dL (ref 0.61–1.24)
GFR calc Af Amer: >60 mL/min (ref >60)
GFR calc non Af Amer: >60 mL/min (ref >60)
GLUCOSE: 123 mg/dL — AB (ref 65–99)
POTASSIUM: 3.6 mmol/L (ref 3.5–5.1)
SODIUM: 141 mmol/L (ref 135–145)
Total Bilirubin: 0.6 mg/dL (ref 0.3–1.2)
Total Protein: 7.4 g/dL (ref 6.5–8.1)

## 2015-04-11 LAB — ETHANOL: Alcohol, Ethyl (B): 11 mg/dL — ABNORMAL HIGH (ref <5)

## 2015-04-11 LAB — ACETAMINOPHEN LEVEL

## 2015-04-11 LAB — URINE DRUG SCREEN, QUALITATIVE (ARMC ONLY)
AMPHETAMINES, UR SCREEN: NOT DETECTED
BENZODIAZEPINE, UR SCRN: POSITIVE — AB
Barbiturates, Ur Screen: NOT DETECTED
COCAINE METABOLITE, UR ~~LOC~~: NOT DETECTED
Cannabinoid 50 Ng, Ur ~~LOC~~: NOT DETECTED
MDMA (ECSTASY) UR SCREEN: NOT DETECTED
METHADONE SCREEN, URINE: NOT DETECTED
OPIATE, UR SCREEN: NOT DETECTED
Phencyclidine (PCP) Ur S: NOT DETECTED
Tricyclic, Ur Screen: POSITIVE — AB

## 2015-04-11 LAB — SALICYLATE LEVEL: Salicylate Lvl: <4 mg/dL (ref 2.8–30.0)
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
## 2015-04-11 NOTE — ED Notes (Signed)
Report to amy in bhu and pt walked over with myiself and officer.  Pt in nad.
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
## 2015-04-11 NOTE — ED Notes (Signed)
Pt sitting in day room with other patients. No complaints. No distress. Maintained on 15 minute checks and observation by security camera for safety.

## 2015-04-11 NOTE — ED Notes (Signed)
ED BHU PLACEMENT JUSTIFICATION Is the patient under IVC or is there intent for IVC: No. Is the patient medically cleared: Yes.   Is there vacancy in the ED BHU: Yes.   Is the population mix appropriate for patient: Yes.   Is the patient awaiting placement in inpatient or outpatient setting: No. Has the patient had a psychiatric consult: Yes.   Survey of unit performed for contraband, proper placement and condition of furniture, tampering with fixtures in bathroom, shower, and each patient room: Yes.  ; Findings:  APPEARANCE/BEHAVIOR calm and cooperative NEURO ASSESSMENT Orientation: time, place and person Hallucinations: No.None noted (Hallucinations) Speech: Normal Gait: normal RESPIRATORY ASSESSMENT Normal expansion.  Clear to auscultation.  No rales, rhonchi, or wheezing. CARDIOVASCULAR ASSESSMENT regular rate and rhythm, S1, S2 normal, no murmur, click, rub or gallop GASTROINTESTINAL ASSESSMENT soft, nontender, BS WNL, no r/g EXTREMITIES normal strength, tone, and muscle mass PLAN OF CARE Provide calm/safe environment. Vital signs assessed twice daily. ED BHU Assessment once each 12-hour shift. Collaborate with intake RN daily or as condition indicates. Assure the ED provider has rounded once each shift. Provide and encourage hygiene. Provide redirection as needed. Assess for escalating behavior; address immediately and inform ED provider.  Assess family dynamic and appropriateness for visitation as needed: No.; If necessary, describe findings: family unavailable Educate the patient/family about BHU procedures/visitation: Yes.  ; If necessary, describe findings:

## 2015-04-11 NOTE — BH Assessment (Signed)
Received information for tele-assessment and spoke with patients nurse. Will arrange machine in patients room and contact [phone] when ready.    Tele-assessment to commence shortly.   Davina PokeJoVea Yetunde Leis, LCSW Therapeutic Triage Specialist [facility] 04/11/2015 4:02 PM

## 2015-04-11 NOTE — ED Notes (Signed)
Patient resting quietly in room. No noted distress or abnormal behaviors noted. Will continue 15 minute checks and observation by security camera for safety. 
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
## 2015-04-11 NOTE — ED Notes (Signed)
Patient assigned to appropriate care area. Patient oriented to unit/care area: Informed that, for their safety, care areas are designed for safety and monitored by security cameras at all times; and visiting hours explained to patient. Patient verbalizes understanding, and verbal contract for safety obtained. 

## 2015-04-11 NOTE — ED Notes (Signed)
[facility] The number that was given would not connect. Psychiatrist stated pt is ready to be discharged.

## 2015-04-11 NOTE — ED Provider Notes (Signed)
-----------------------------------------   6:40 PM on 04/11/2015 -----------------------------------------  [name] [name] has been seen by [name], psychiatry. [name] has spoke with the group home. The patient has now been released from the involuntary commitment by [name] in the group home reports that they will accept the patient back. We'll discharge him from the emergency department.  [name] Ramusavid W Lonald Troiani, MD 04/11/15 [phone]

## 2015-04-11 NOTE — ED Notes (Signed)
Pt escorted to bathroom and belongings returned and pt changed.  Pt denies any items missing.  Pt escorted to lobby to meet group home staff.

## 2015-04-11 NOTE — Discharge Instructions (Signed)
He may return to the group home. Please follow up with your usual provider. Return to the emergency department if there are any urgent concerns.

## 2015-04-11 NOTE — ED Notes (Signed)
Pt will be discharged back to his group home. Pt cooperative. No complaints or distress. Maintained on 15 minute checks and observation by security camera for safety.

## 2015-04-11 NOTE — BHH Counselor (Signed)
Per Dr T[facility] at [facility] ED, pt is being discharged and will not need a teleassessment. Writer notified TTS JoVea who was scheduled to do the teleassessment.  [name] [name], [place] Therapeutic Triage Specialist
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
## 2015-05-06 ENCOUNTER — Emergency Department
Admission: EM | Admit: 2015-05-06 | Discharge: 2015-05-07 | Disposition: A | Discharge status: Home / Self Care | Payer: Medicare Other | Attending: Emergency Medicine | Admitting: Emergency Medicine

## 2015-05-06 ENCOUNTER — Encounter: Payer: Self-pay | Admitting: *Deleted

## 2015-05-06 ENCOUNTER — Emergency Department: Payer: Medicare Other

## 2015-05-06 DIAGNOSIS — W2209XA Striking against other stationary object, initial encounter: Secondary | ICD-10-CM | POA: Diagnosis not present

## 2015-05-06 DIAGNOSIS — F1310 Sedative, hypnotic or anxiolytic abuse, uncomplicated: Secondary | ICD-10-CM | POA: Diagnosis not present

## 2015-05-06 DIAGNOSIS — Z79899 Other long term (current) drug therapy: Secondary | ICD-10-CM | POA: Insufficient documentation

## 2015-05-06 DIAGNOSIS — Y998 Other external cause status: Secondary | ICD-10-CM | POA: Diagnosis not present

## 2015-05-06 DIAGNOSIS — Y9289 Other specified places as the place of occurrence of the external cause: Secondary | ICD-10-CM | POA: Diagnosis not present

## 2015-05-06 DIAGNOSIS — Y9389 Activity, other specified: Secondary | ICD-10-CM | POA: Diagnosis not present

## 2015-05-06 DIAGNOSIS — S6991XA Unspecified injury of right wrist, hand and finger(s), initial encounter: Secondary | ICD-10-CM | POA: Diagnosis present

## 2015-05-06 DIAGNOSIS — S60221A Contusion of right hand, initial encounter: Secondary | ICD-10-CM | POA: Insufficient documentation

## 2015-05-06 LAB — COMPREHENSIVE METABOLIC PANEL
ALBUMIN: 4.3 g/dL (ref 3.5–5.0)
ALT: 71 U/L — AB (ref 17–63)
AST: 37 U/L (ref 15–41)
Alkaline Phosphatase: 69 U/L (ref 38–126)
Anion gap: 10 (ref 5–15)
BUN: 9 mg/dL (ref 6–20)
CHLORIDE: 103 mmol/L (ref 101–111)
CO2: 26 mmol/L (ref 22–32)
CREATININE: 0.66 mg/dL (ref 0.61–1.24)
Calcium: 9.4 mg/dL (ref 8.9–10.3)
GFR calc Af Amer: >60 mL/min (ref >60)
GLUCOSE: 117 mg/dL — AB (ref 65–99)
POTASSIUM: 3.8 mmol/L (ref 3.5–5.1)
SODIUM: 139 mmol/L (ref 135–145)
Total Bilirubin: 0.9 mg/dL (ref 0.3–1.2)
Total Protein: 8 g/dL (ref 6.5–8.1)

## 2015-05-06 LAB — CBC
HEMATOCRIT: 48.8 % (ref 40.0–52.0)
HEMOGLOBIN: 16.7 g/dL (ref 13.0–18.0)
MCH: 32.2 pg (ref 26.0–34.0)
MCHC: 34.2 g/dL (ref 32.0–36.0)
MCV: 94.1 fL (ref 80.0–100.0)
Platelets: 214 10*3/uL (ref 150–440)
RBC: 5.18 MIL/uL (ref 4.40–5.90)
RDW: 12.5 % (ref 11.5–14.5)
WBC: 12.6 10*3/uL — ABNORMAL HIGH (ref 3.8–10.6)

## 2015-05-06 LAB — URINE DRUG SCREEN, QUALITATIVE (ARMC ONLY)
Amphetamines, Ur Screen: NOT DETECTED
BARBITURATES, UR SCREEN: NOT DETECTED
BENZODIAZEPINE, UR SCRN: POSITIVE — AB
COCAINE METABOLITE, UR ~~LOC~~: NOT DETECTED
Cannabinoid 50 Ng, Ur ~~LOC~~: NOT DETECTED
MDMA (Ecstasy)Ur Screen: NOT DETECTED
METHADONE SCREEN, URINE: NOT DETECTED
OPIATE, UR SCREEN: NOT DETECTED
PHENCYCLIDINE (PCP) UR S: NOT DETECTED
Tricyclic, Ur Screen: POSITIVE — AB

## 2015-05-06 LAB — ETHANOL: Alcohol, Ethyl (B): <5 mg/dL (ref <5)

## 2015-05-06 MED ORDER — ACETAMINOPHEN 325 MG PO TABS
ORAL_TABLET | ORAL | Status: AC
  Filled 2015-05-06: qty 2
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
## 2015-05-07 MED ORDER — ACETAMINOPHEN 325 MG PO TABS
650.0000 mg | ORAL_TABLET | Freq: Once | ORAL | Status: AC
  Administered 2015-05-07: 650 mg via ORAL

## 2015-05-07 NOTE — ED Notes (Signed)
Report received on pt. Care assumed. Awaiting caregiver from group home to arrive to pick up pt.

## 2015-05-07 NOTE — ED Notes (Signed)
Pt refused to take Tylenol for pain as Dr. Manson Passey recommended, Pt stated " shove up your a.Marland Kitchen"

## 2015-05-07 NOTE — Discharge Instructions (Signed)
Hand Contusion  A hand contusion is a deep bruise on your hand area. Contusions are the result of an injury that caused bleeding under the skin. The contusion may turn blue, purple, or yellow. Minor injuries will give you a painless contusion, but more severe contusions may stay painful and swollen for a few weeks.  CAUSES   A contusion is usually caused by a blow, trauma, or direct force to an area of the body.  SYMPTOMS    Swelling and redness of the injured area.   Discoloration of the injured area.   Tenderness and soreness of the injured area.   Pain.  DIAGNOSIS   The diagnosis can be made by taking a history and performing a physical exam. An X-ray, CT scan, or MRI may be needed to determine if there were any associated injuries, such as broken bones (fractures).  TREATMENT   Often, the best treatment for a hand contusion is resting, elevating, icing, and applying cold compresses to the injured area. Over-the-counter medicines may also be recommended for pain control.  HOME CARE INSTRUCTIONS    Put ice on the injured area.    Put ice in a plastic bag.    Place a towel between your skin and the bag.    Leave the ice on for 15-20 minutes, 03-04 times a day.   Only take over-the-counter or prescription medicines as directed by your caregiver. Your caregiver may recommend avoiding anti-inflammatory medicines (aspirin, ibuprofen, and naproxen) for 48 hours because these medicines may increase bruising.   If told, use an elastic wrap as directed. This can help reduce swelling. You may remove the wrap for sleeping, showering, and bathing. If your fingers become numb, cold, or blue, take the wrap off and reapply it more loosely.   Elevate your hand with pillows to reduce swelling.   Avoid overusing your hand if it is painful.  SEEK IMMEDIATE MEDICAL CARE IF:    You have increased redness, swelling, or pain in your hand.   Your swelling or pain is not relieved with medicines.   You have loss of feeling in  your hand or are unable to move your fingers.   Your hand turns cold or blue.   You have pain when you move your fingers.   Your hand becomes warm to the touch.   Your contusion does not improve in 2 days.  MAKE SURE YOU:    Understand these instructions.   Will watch your condition.   Will get help right away if you are not doing well or get worse.     This information is not intended to replace advice given to you by your health care provider. Make sure you discuss any questions you have with your health care provider.     Document Released: 09/01/2001 Document Revised: 12/05/2011 Document Reviewed: 09/03/2011  Elsevier Interactive Patient Education 2016 Elsevier Inc[employer]

## 2015-05-07 NOTE — ED Notes (Signed)
Called [name] [name] at [phone] informed pt is ready for discharge Mrs. [name] [name] reprots will be in ER in an hr to pick him up.

## 2015-05-08 ENCOUNTER — Emergency Department
Admission: EM | Admit: 2015-05-08 | Discharge: 2015-05-08 | Disposition: A | Discharge status: Home / Self Care | Payer: Medicare Other | Attending: Emergency Medicine | Admitting: Emergency Medicine

## 2015-05-08 ENCOUNTER — Emergency Department: Payer: Medicare Other

## 2015-05-08 ENCOUNTER — Encounter: Payer: Self-pay | Admitting: Emergency Medicine

## 2015-05-08 DIAGNOSIS — S29001A Unspecified injury of muscle and tendon of front wall of thorax, initial encounter: Secondary | ICD-10-CM | POA: Diagnosis present

## 2015-05-08 DIAGNOSIS — Y9389 Activity, other specified: Secondary | ICD-10-CM | POA: Insufficient documentation

## 2015-05-08 DIAGNOSIS — Y998 Other external cause status: Secondary | ICD-10-CM | POA: Insufficient documentation

## 2015-05-08 DIAGNOSIS — Z79899 Other long term (current) drug therapy: Secondary | ICD-10-CM | POA: Insufficient documentation

## 2015-05-08 DIAGNOSIS — S20211A Contusion of right front wall of thorax, initial encounter: Secondary | ICD-10-CM

## 2015-05-08 DIAGNOSIS — Y9289 Other specified places as the place of occurrence of the external cause: Secondary | ICD-10-CM | POA: Insufficient documentation

## 2015-05-08 NOTE — Discharge Instructions (Signed)
Chest Contusion A contusion is a deep bruise. Bruises happen when an injury causes bleeding under the skin. Signs of bruising include pain, puffiness (swelling), and discolored skin. The bruise may turn blue, purple, or yellow.  HOME CARE  Put ice on the injured area.  Put ice in a plastic bag.  Place a towel between the skin and the bag.  Leave the ice on for 15-20 minutes at a time, 03-04 times a day for the first 48 hours.  Only take medicine as told by your doctor.  Rest.  Take deep breaths (deep-breathing exercises) as told by your doctor.  Stop smoking if you smoke.  Do not lift objects over 5 pounds (2.3 kilograms) for 3 days or longer if told by your doctor. GET HELP RIGHT AWAY IF:   You have more bruising or puffiness.  You have pain that gets worse.  You have trouble breathing.  You are dizzy, weak, or pass out (faint).  You have blood in your pee (urine) or poop (stool).  You cough up or throw up (vomit) blood.  Your puffiness or pain is not helped with medicines. MAKE SURE YOU:   Understand these instructions.  Will watch your condition.  Will get help right away if you are not doing well or get worse.   This information is not intended to replace advice given to you by your health care provider. Make sure you discuss any questions you have with your health care provider.   Document Released: 08/29/2007 Document Revised: 12/05/2011 Document Reviewed: 09/03/2011 Elsevier Interactive Patient Education 2016 [place] A contusion is a deep bruise. Contusions happen when an injury causes bleeding under the skin. Symptoms of bruising include pain, swelling, and discolored skin. The skin may turn blue, purple, or yellow. HOME CARE   Rest the injured area.  If told, put ice on the injured area.  Put ice in a plastic bag.  Place a towel between your skin and the bag.  Leave the ice on for 20 minutes, 2-3 times per day.  If told, put  light pressure (compression) on the injured area using an elastic bandage. Make sure the bandage is not too tight. Remove it and put it back on as told by your doctor.  If possible, raise (elevate) the injured area above the level of your heart while you are sitting or lying down.  Take over-the-counter and prescription medicines only as told by your doctor. GET HELP IF:  Your symptoms do not get better after several days of treatment.  Your symptoms get worse.  You have trouble moving the injured area. GET HELP RIGHT AWAY IF:   You have very bad pain.  You have a loss of feeling (numbness) in a hand or foot.  Your hand or foot turns pale or cold.   This information is not intended to replace advice given to you by your health care provider. Make sure you discuss any questions you have with your health care provider.   Document Released: 08/29/2007 Document Revised: 12/01/2014 Document Reviewed: 07/28/2014 Elsevier Interactive Patient Education 2016 [place].  Cryotherapy Cryotherapy means treatment with cold. Ice or gel packs can be used to reduce both pain and swelling. Ice is the most helpful within the first 24 to 48 hours after an injury or flare-up from overusing a muscle or joint. Sprains, strains, spasms, burning pain, shooting pain, and aches can all be eased with ice. Ice can also be used when recovering from surgery.  Ice is effective, has very few side effects, and is safe for most people to use. PRECAUTIONS  Ice is not a safe treatment option for people with:  Raynaud phenomenon. This is a condition affecting small blood vessels in the extremities. Exposure to cold may cause your problems to return.  Cold hypersensitivity. There are many forms of cold hypersensitivity, including:  Cold urticaria. Red, itchy hives appear on the skin when the tissues begin to warm after being iced.  Cold erythema. This is a red, itchy rash caused by exposure to cold.  Cold  hemoglobinuria. Red blood cells break down when the tissues begin to warm after being iced. The hemoglobin that carry oxygen are passed into the urine because they cannot combine with blood proteins fast enough.  Numbness or altered sensitivity in the area being iced. If you have any of the following conditions, do not use ice until you have discussed cryotherapy with your caregiver:  Heart conditions, such as arrhythmia, angina, or chronic heart disease.  High blood pressure.  Healing wounds or open skin in the area being iced.  Current infections.  Rheumatoid arthritis.  Poor circulation.  Diabetes. Ice slows the blood flow in the region it is applied. This is beneficial when trying to stop inflamed tissues from spreading irritating chemicals to surrounding tissues. However, if you expose your skin to cold temperatures for too long or without the proper protection, you can damage your skin or nerves. Watch for signs of skin damage due to cold. HOME CARE INSTRUCTIONS Follow these tips to use ice and cold packs safely.  Place a dry or damp towel between the ice and skin. A damp towel will cool the skin more quickly, so you may need to shorten the time that the ice is used.  For a more rapid response, add gentle compression to the ice.  Ice for no more than 10 to 20 minutes at a time. The bonier the area you are icing, the less time it will take to get the benefits of ice.  Check your skin after 5 minutes to make sure there are no signs of a poor response to cold or skin damage.  Rest 20 minutes or more between uses.  Once your skin is numb, you can end your treatment. You can test numbness by very lightly touching your skin. The touch should be so light that you do not see the skin dimple from the pressure of your fingertip. When using ice, most people will feel these normal sensations in this order: cold, burning, aching, and numbness.  Do not use ice on someone who cannot  communicate their responses to pain, such as small children or people with dementia. HOW TO MAKE AN ICE PACK Ice packs are the most common way to use ice therapy. Other methods include ice massage, ice baths, and cryosprays. Muscle creams that cause a cold, tingly feeling do not offer the same benefits that ice offers and should not be used as a substitute unless recommended by your caregiver. To make an ice pack, do one of the following:  Place crushed ice or a bag of frozen vegetables in a sealable plastic bag. Squeeze out the excess air. Place this bag inside another plastic bag. Slide the bag into a pillowcase or place a damp towel between your skin and the bag.  Mix 3 parts water with 1 part rubbing alcohol. Freeze the mixture in a sealable plastic bag. When you remove the mixture from the freezer, it  will be slushy. Squeeze out the excess air. Place this bag inside another plastic bag. Slide the bag into a pillowcase or place a damp towel between your skin and the bag. SEEK MEDICAL CARE IF:  You develop white spots on your skin. This may give the skin a blotchy (mottled) appearance.  Your skin turns blue or pale.  Your skin becomes waxy or hard.  Your swelling gets worse. MAKE SURE YOU:   Understand these instructions.  Will watch your condition.  Will get help right away if you are not doing well or get worse.   This information is not intended to replace advice given to you by your health care provider. Make sure you discuss any questions you have with your health care provider.   Document Released: 11/06/2010 Document Revised: 04/02/2014 Document Reviewed: 11/06/2010 Elsevier Interactive Patient Education [employer] Inc.
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
## 2015-06-08 ENCOUNTER — Emergency Department: Payer: Medicare Other

## 2015-06-08 ENCOUNTER — Encounter: Payer: Self-pay | Admitting: Medical Oncology

## 2015-06-08 ENCOUNTER — Emergency Department
Admission: EM | Admit: 2015-06-08 | Discharge: 2015-06-09 | Disposition: A | Discharge status: Home / Self Care | Payer: Medicare Other | Attending: Emergency Medicine | Admitting: Emergency Medicine

## 2015-06-08 DIAGNOSIS — F1310 Sedative, hypnotic or anxiolytic abuse, uncomplicated: Secondary | ICD-10-CM | POA: Diagnosis not present

## 2015-06-08 DIAGNOSIS — Y998 Other external cause status: Secondary | ICD-10-CM | POA: Diagnosis not present

## 2015-06-08 DIAGNOSIS — Z79899 Other long term (current) drug therapy: Secondary | ICD-10-CM | POA: Insufficient documentation

## 2015-06-08 DIAGNOSIS — S62609A Fracture of unspecified phalanx of unspecified finger, initial encounter for closed fracture: Secondary | ICD-10-CM

## 2015-06-08 DIAGNOSIS — F4325 Adjustment disorder with mixed disturbance of emotions and conduct: Secondary | ICD-10-CM | POA: Diagnosis not present

## 2015-06-08 DIAGNOSIS — S60415A Abrasion of left ring finger, initial encounter: Secondary | ICD-10-CM | POA: Diagnosis not present

## 2015-06-08 DIAGNOSIS — F209 Schizophrenia, unspecified: Secondary | ICD-10-CM | POA: Diagnosis present

## 2015-06-08 DIAGNOSIS — S29001A Unspecified injury of muscle and tendon of front wall of thorax, initial encounter: Secondary | ICD-10-CM | POA: Diagnosis not present

## 2015-06-08 DIAGNOSIS — Y9389 Activity, other specified: Secondary | ICD-10-CM | POA: Insufficient documentation

## 2015-06-08 DIAGNOSIS — S60042A Contusion of left ring finger without damage to nail, initial encounter: Secondary | ICD-10-CM | POA: Insufficient documentation

## 2015-06-08 DIAGNOSIS — F919 Conduct disorder, unspecified: Secondary | ICD-10-CM | POA: Diagnosis not present

## 2015-06-08 DIAGNOSIS — R4689 Other symptoms and signs involving appearance and behavior: Secondary | ICD-10-CM

## 2015-06-08 DIAGNOSIS — W228XXA Striking against or struck by other objects, initial encounter: Secondary | ICD-10-CM | POA: Diagnosis not present

## 2015-06-08 DIAGNOSIS — Y9289 Other specified places as the place of occurrence of the external cause: Secondary | ICD-10-CM | POA: Diagnosis not present

## 2015-06-08 DIAGNOSIS — Z23 Encounter for immunization: Secondary | ICD-10-CM | POA: Insufficient documentation

## 2015-06-08 LAB — COMPREHENSIVE METABOLIC PANEL
ALT: 70 U/L — ABNORMAL HIGH (ref 17–63)
ANION GAP: 9 (ref 5–15)
AST: 44 U/L — ABNORMAL HIGH (ref 15–41)
Albumin: 3.9 g/dL (ref 3.5–5.0)
Alkaline Phosphatase: 60 U/L (ref 38–126)
BILIRUBIN TOTAL: 0.6 mg/dL (ref 0.3–1.2)
BUN: 14 mg/dL (ref 6–20)
CO2: 24 mmol/L (ref 22–32)
Calcium: 8.8 mg/dL — ABNORMAL LOW (ref 8.9–10.3)
Chloride: 104 mmol/L (ref 101–111)
Creatinine, Ser: 0.75 mg/dL (ref 0.61–1.24)
GFR calc Af Amer: >60 mL/min (ref >60)
Glucose, Bld: 135 mg/dL — ABNORMAL HIGH (ref 65–99)
POTASSIUM: 3.6 mmol/L (ref 3.5–5.1)
Sodium: 137 mmol/L (ref 135–145)
TOTAL PROTEIN: 7.2 g/dL (ref 6.5–8.1)

## 2015-06-08 LAB — CBC
HCT: 43.5 % (ref 40.0–52.0)
Hemoglobin: 15 g/dL (ref 13.0–18.0)
MCH: 32.5 pg (ref 26.0–34.0)
MCHC: 34.5 g/dL (ref 32.0–36.0)
MCV: 94.1 fL (ref 80.0–100.0)
PLATELETS: 209 10*3/uL (ref 150–440)
RBC: 4.63 MIL/uL (ref 4.40–5.90)
RDW: 12.4 % (ref 11.5–14.5)
WBC: 7.1 10*3/uL (ref 3.8–10.6)

## 2015-06-08 LAB — ETHANOL

## 2015-06-08 LAB — URINE DRUG SCREEN, QUALITATIVE (ARMC ONLY)
AMPHETAMINES, UR SCREEN: NOT DETECTED
BENZODIAZEPINE, UR SCRN: POSITIVE — AB
Barbiturates, Ur Screen: NOT DETECTED
Cannabinoid 50 Ng, Ur ~~LOC~~: NOT DETECTED
Cocaine Metabolite,Ur ~~LOC~~: NOT DETECTED
MDMA (Ecstasy)Ur Screen: NOT DETECTED
METHADONE SCREEN, URINE: NOT DETECTED
Opiate, Ur Screen: NOT DETECTED
Phencyclidine (PCP) Ur S: NOT DETECTED
Tricyclic, Ur Screen: POSITIVE — AB

## 2015-06-08 LAB — ACETAMINOPHEN LEVEL

## 2015-06-08 LAB — VALPROIC ACID LEVEL: VALPROIC ACID LVL: 79 ug/mL (ref 50.0–100.0)

## 2015-06-08 LAB — SALICYLATE LEVEL: Salicylate Lvl: <4 mg/dL (ref 2.8–30.0)

## 2015-06-08 MED ORDER — ACETAMINOPHEN 500 MG PO TABS
1000.0000 mg | ORAL_TABLET | Freq: Once | ORAL | Status: AC
  Administered 2015-06-08: 1000 mg via ORAL
  Filled 2015-06-08: qty 2

## 2015-06-08 MED ORDER — LORAZEPAM 2 MG/ML IJ SOLN: 2.0000 mg | Freq: Once | INTRAMUSCULAR | Status: DC

## 2015-06-08 MED ORDER — HALOPERIDOL LACTATE 5 MG/ML IJ SOLN: 5.0000 mg | Freq: Once | INTRAMUSCULAR | Status: DC

## 2015-06-08 MED ORDER — TETANUS-DIPHTH-ACELL PERTUSSIS 5-2.5-18.5 LF-MCG/0.5 IM SUSP
0.5000 mL | Freq: Once | INTRAMUSCULAR | Status: AC
  Administered 2015-06-08: 0.5 mL via INTRAMUSCULAR
  Filled 2015-06-08: qty 0.5
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
## 2015-06-09 DIAGNOSIS — F4325 Adjustment disorder with mixed disturbance of emotions and conduct: Secondary | ICD-10-CM

## 2015-06-09 DIAGNOSIS — S62609A Fracture of unspecified phalanx of unspecified finger, initial encounter for closed fracture: Secondary | ICD-10-CM

## 2015-06-09 NOTE — Progress Notes (Signed)
TTS spoke with the pts group home supervisor ([facility]) @ [phone] who has expressed that someone will be here to transport the pt within the hour.    06/09/2015 Cheryl FlashNicole Bryston Colocho, MS, NCC, LPCA Therapeutic Triage Specialist

## 2015-06-09 NOTE — ED Notes (Signed)
BEHAVIORAL HEALTH ROUNDING Patient sleeping: No. Patient alert and oriented: yes Behavior appropriate: Yes.  ; If no, describe:  Nutrition and fluids offered: yes Toileting and hygiene offered: Yes  Sitter present: q15 minute observations and security  monitoring Law enforcement present: Yes  ODS  

## 2015-06-09 NOTE — ED Notes (Signed)
Lunch provided along with an extra drink  - [facility] has consulted with him - IVC rescinded and he will be returning to his group home  - Joni Reiningicole TTS has talked with [facility] and they stated that they will be here in one hour to pick him up

## 2015-06-09 NOTE — ED Notes (Signed)
Haldol and Ativan held per Dr. Glenetta HewMcLaurin verbal order. Pt is currently calm and cooperative. Pt will continue to be monitored q 15 min.
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
## 2015-06-09 NOTE — ED Provider Notes (Addendum)
-----------------------------------------   7:26 AM on 06/09/2015 -----------------------------------------   Blood pressure 112/76, pulse 98, temperature 97.8 F (36.6 C), temperature source Oral, resp. rate 18, weight 228 lb (103.42 kg), SpO2 98 %.  The patient had no acute events since last update.  Calm and cooperative at this time.  Disposition is pending per Psychiatry/Behavioral Medicine team recommendations.   Patient was seen and evaluated by psychiatry and felt stable for discharge. Patient was discharged to follow-up with [facility].  Jennye MoccasinBrian S Yarissa Reining, MD 06/09/15 [id number]  Jennye MoccasinBrian S Damont Balles, MD 06/09/15 1314

## 2015-06-09 NOTE — ED Notes (Signed)
Patient observed lying in bed with eyes closed  Even, unlabored respirations observed   NAD pt appears to be sleeping  I will continue to monitor along with every 15 minute visual observations and ongoing security monitoring    

## 2015-06-09 NOTE — ED Notes (Signed)

## 2015-06-09 NOTE — ED Notes (Signed)
ED BHU PLACEMENT JUSTIFICATION Is the patient under IVC or is there intent for IVC: Yes.   Is the patient medically cleared:  Splint in place  Is there vacancy in the ED BHU: Yes.   Is the population mix appropriate for patient: Yes.   Is the patient awaiting placement in inpatient or outpatient setting:  Has the patient had a psychiatric consult:  pending   Survey of unit performed for contraband, proper placement and condition of furniture, tampering with fixtures in bathroom, shower, and each patient room: Yes.  ; Findings:  APPEARANCE/BEHAVIOR Calm and cooperative NEURO ASSESSMENT Orientation: oriented x3  Denies pain Hallucinations: No.None noted (Hallucinations) Speech: Normal Gait: normal RESPIRATORY ASSESSMENT Even  Unlabored respirations  CARDIOVASCULAR ASSESSMENT Pulses equal   regular rate  Skin warm and dry   GASTROINTESTINAL ASSESSMENT no GI complaint EXTREMITIES Full ROM  PLAN OF CARE Provide calm/safe environment. Vital signs assessed twice daily. ED BHU Assessment once each 12-hour shift. Collaborate with intake RN daily or as condition indicates. Assure the ED provider has rounded once each shift. Provide and encourage hygiene. Provide redirection as needed. Assess for escalating behavior; address immediately and inform ED provider.  Assess family dynamic and appropriateness for visitation as needed: Yes.  ; If necessary, describe findings:  Educate the patient/family about BHU procedures/visitation: Yes.  ; If necessary, describe findings:

## 2015-06-09 NOTE — ED Notes (Signed)
Breakfast provided   Pt observed lying in bed  NAD assessed

## 2015-06-26 ENCOUNTER — Other Ambulatory Visit: Payer: Self-pay

## 2015-09-01 ENCOUNTER — Emergency Department
Admission: EM | Admit: 2015-09-01 | Discharge: 2015-09-02 | Disposition: A | Discharge status: Home / Self Care | Payer: Medicare Other | Attending: Emergency Medicine | Admitting: Emergency Medicine

## 2015-09-01 DIAGNOSIS — F209 Schizophrenia, unspecified: Secondary | ICD-10-CM | POA: Diagnosis present

## 2015-09-01 DIAGNOSIS — R451 Restlessness and agitation: Secondary | ICD-10-CM | POA: Insufficient documentation

## 2015-09-01 DIAGNOSIS — F319 Bipolar disorder, unspecified: Secondary | ICD-10-CM | POA: Diagnosis not present

## 2015-09-01 DIAGNOSIS — R4689 Other symptoms and signs involving appearance and behavior: Secondary | ICD-10-CM | POA: Diagnosis present

## 2015-09-01 DIAGNOSIS — F918 Other conduct disorders: Secondary | ICD-10-CM | POA: Diagnosis present

## 2015-09-01 DIAGNOSIS — F4325 Adjustment disorder with mixed disturbance of emotions and conduct: Secondary | ICD-10-CM | POA: Diagnosis present

## 2015-09-01 LAB — URINALYSIS COMPLETE WITH MICROSCOPIC (ARMC ONLY)
Bacteria, UA: NONE SEEN
Bilirubin Urine: NEGATIVE
Glucose, UA: NEGATIVE mg/dL
HGB URINE DIPSTICK: NEGATIVE
KETONES UR: NEGATIVE mg/dL
LEUKOCYTES UA: NEGATIVE
NITRITE: NEGATIVE
PH: 7 (ref 5.0–8.0)
PROTEIN: NEGATIVE mg/dL
RBC / HPF: NONE SEEN RBC/hpf (ref 0–5)
SPECIFIC GRAVITY, URINE: 1.01 (ref 1.005–1.030)
Squamous Epithelial / LPF: NONE SEEN

## 2015-09-01 LAB — BASIC METABOLIC PANEL
Anion gap: 9 (ref 5–15)
BUN: 9 mg/dL (ref 6–20)
CHLORIDE: 104 mmol/L (ref 101–111)
CO2: 24 mmol/L (ref 22–32)
CREATININE: 0.61 mg/dL (ref 0.61–1.24)
Calcium: 9 mg/dL (ref 8.9–10.3)
GFR calc non Af Amer: >60 mL/min (ref >60)
Glucose, Bld: 106 mg/dL — ABNORMAL HIGH (ref 65–99)
POTASSIUM: 4.2 mmol/L (ref 3.5–5.1)
Sodium: 137 mmol/L (ref 135–145)

## 2015-09-01 LAB — URINE DRUG SCREEN, QUALITATIVE (ARMC ONLY)
AMPHETAMINES, UR SCREEN: NOT DETECTED
BARBITURATES, UR SCREEN: NOT DETECTED
BENZODIAZEPINE, UR SCRN: POSITIVE — AB
Cannabinoid 50 Ng, Ur ~~LOC~~: NOT DETECTED
Cocaine Metabolite,Ur ~~LOC~~: NOT DETECTED
MDMA (Ecstasy)Ur Screen: NOT DETECTED
Methadone Scn, Ur: NOT DETECTED
OPIATE, UR SCREEN: NOT DETECTED
Phencyclidine (PCP) Ur S: NOT DETECTED
TRICYCLIC, UR SCREEN: POSITIVE — AB

## 2015-09-01 LAB — CBC
HEMATOCRIT: 44 % (ref 40.0–52.0)
HEMOGLOBIN: 15.4 g/dL (ref 13.0–18.0)
MCH: 32 pg (ref 26.0–34.0)
MCHC: 35 g/dL (ref 32.0–36.0)
MCV: 91.2 fL (ref 80.0–100.0)
Platelets: 218 10*3/uL (ref 150–440)
RBC: 4.82 MIL/uL (ref 4.40–5.90)
RDW: 12.1 % (ref 11.5–14.5)
WBC: 9 10*3/uL (ref 3.8–10.6)

## 2015-09-01 LAB — ETHANOL
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
## 2015-09-01 NOTE — ED Provider Notes (Signed)
[facility] Emergency Department Provider Note   ____________________________________________  Time seen: Approximately 930 PM  I have reviewed the triage vital signs and the nursing notes.   HISTORY  Chief Complaint Aggressive Behavior   HPI [name] [name] is a 30 y.o. male with a history of schizophrenia as well as bipolar disorder who is presenting to the emergency department under involuntary commitment after being agitated at his group home.  The group home says that the patient is hearing voices and threatening to injure the staff. The patient denies hearing voices and does admit that he was agitated before but says he feels more calm at this time.   Past Medical History  Diagnosis Date  . Fatty liver   . Schizophrenia (HCC)   . Depression   . Anxiety   . Bipolar affective disorder [facility])     Patient Active Problem List   Diagnosis Date Noted  . Broken finger 06/09/2015  . Gastric reflux syndrome 04/05/2015  . Undifferentiated schizophrenia (HCC)   . Schizophrenia (HCC) 01/18/2015  . Aggressive behavior 11/25/2013  . 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  Current Outpatient Rx  Name  Route  Sig  Dispense  Refill  . acetaminophen (TYLENOL) 500 MG tablet   Oral   Take 1,000 mg by mouth every 6 (six) hours as needed for moderate pain.         . Cholecalciferol (VITAMIN D3) 10000 units TABS   Oral   Take 1 tablet by mouth daily.         . cyclobenzaprine (FLEXERIL) 10 MG tablet   Oral   Take 10 mg by mouth 3 (three) times daily as needed for muscle spasms.         . divalproex (DEPAKOTE ER) 500 MG 24 hr tablet   Oral   Take 1,000 mg by mouth 2 (two) times daily.         . fluticasone (FLONASE) 50 MCG/ACT nasal spray   Each Nare   Place 2 sprays into both nostrils daily as needed for rhinitis.         . haloperidol (HALDOL) 5 MG tablet  Oral   Take 5 mg by mouth 3 (three) times daily.         . hydrOXYzine (ATARAX/VISTARIL) 25 MG tablet   Oral   Take 25 mg by mouth every 6 (six) hours as needed for itching.         Marland Kitchen LORazepam (ATIVAN) 2 MG tablet   Oral   Take 2 mg by mouth every 6 (six) hours as needed (for agitation).         . mineral oil-hydrophilic petrolatum (AQUAPHOR) ointment   Topical   Apply 1 application topically 2 (two) times daily as needed (itching).         . paliperidone (INVEGA SUSTENNA) 234 MG/1.5ML SUSP injection   Intramuscular   Inject 234 mg into the muscle every 28 (twenty-eight) days. To be given on the 22nd day of the month         . pantoprazole (PROTONIX) 40 MG tablet   Oral   Take 1 tablet (40 mg total) by mouth daily.   30 tablet   0   . zolpidem (AMBIEN) 5 MG tablet   Oral   Take 5 mg by mouth at bedtime.           Allergies Ibuprofen and Zoloft  No family history on file.  Social History Social History  Substance Use Topics  . Smoking status: Never Smoker   . Smokeless tobacco: Never Used  . Alcohol Use: No    Review of Systems Constitutional: No fever/chills Eyes: No visual changes. ENT: No sore throat. Cardiovascular: Denies chest pain. Respiratory: Denies shortness of breath. Gastrointestinal: No abdominal pain.  No nausea, no vomiting.  No diarrhea.  No constipation. Genitourinary: Negative for dysuria. Musculoskeletal: Negative for back pain. Skin: Negative for rash. Neurological: Negative for headaches, focal weakness or numbness.  10-point ROS otherwise negative.  ____________________________________________   PHYSICAL EXAM:  VITAL SIGNS: ED Triage Vitals  Enc Vitals Group     BP 09/01/15 2037 135/94 mmHg     Pulse Rate 09/01/15 2037 118     Resp 09/01/15 2037 20     Temp 09/01/15 2037 98.4 F (36.9 C)     Temp Source 09/01/15 2037 Oral     SpO2 09/01/15 2037 97 %     Weight 09/01/15 2037 246 lb (111.585 kg)     Height  09/01/15 2037 5\' 10"  (1.778 m)     Head Cir --      Peak Flow --      Pain Score 09/01/15 2059 0     Pain Loc --      Pain Edu? --      Excl. in GC? --     Constitutional: Alert and oriented. Well appearing and in no acute distress. Eyes: Conjunctivae are normal. PERRL. EOMI. Head: Atraumatic. Nose: No congestion/rhinnorhea. Mouth/Throat: Mucous membranes are moist.  Oropharynx non-erythematous. Neck: No stridor.   Cardiovascular: Tachycardic, regular rhythm. Grossly normal heart sounds.  Good peripheral circulation. Respiratory: Normal respiratory effort.  No retractions. Lungs CTAB. Gastrointestinal: Soft and nontender. No distention. No abdominal bruits. No CVA tenderness. Musculoskeletal: No lower extremity tenderness nor edema.  No joint effusions. Neurologic:  Normal speech and language. No gross focal neurologic deficits are appreciated. No gait instability. Skin:  Skin is warm, dry and intact. No rash noted. Psychiatric: Mood and affect are normal. Speech and behavior are normal.  ____________________________________________   LABS (all labs ordered are listed, but only abnormal results are displayed)  Labs Reviewed  BASIC METABOLIC PANEL - Abnormal; Notable for the following:    Glucose, Bld 106 (*)    All other components within normal limits  CBC  ETHANOL  URINALYSIS COMPLETEWITH MICROSCOPIC ([facility] ONLY)  URINE DRUG SCREEN, QUALITATIVE ([facility] ONLY)   ____________________________________________  EKG   ____________________________________________  RADIOLOGY   ____________________________________________   PROCEDURES   ____________________________________________   INITIAL IMPRESSION / ASSESSMENT AND PLAN / ED COURSE  Pertinent labs & imaging results that were available during my care of the patient were reviewed by me and considered in my medical decision making (see chart for details).  We'll uphold the patient's involuntary commitment. Does have  tachycardia on exam however no complete of chest pain or shortness of breath. We'll observe. ____________________________________________   FINAL CLINICAL IMPRESSION(S) / ED DIAGNOSES  Agitation.    NEW MEDICATIONS STARTED DURING THIS VISIT:  New Prescriptions   No medications on file     Note:  This document was prepared using Dragon voice recognition software and may include unintentional dictation errors.    Myrna Blazeravid Odarius Kelleen Stolze, MD 09/01/15 2215

## 2015-09-01 NOTE — ED Notes (Signed)
Pt under IVC from group home due to aggressive behavior pt denies any SI or HI.

## 2015-09-02 DIAGNOSIS — F4325 Adjustment disorder with mixed disturbance of emotions and conduct: Secondary | ICD-10-CM

## 2015-09-02 DIAGNOSIS — R451 Restlessness and agitation: Secondary | ICD-10-CM | POA: Diagnosis not present

## 2015-09-02 MED ORDER — FLUTICASONE PROPIONATE 50 MCG/ACT NA SUSP
2.0000 | Freq: Every day | NASAL | Status: DC | PRN
Start: 2015-09-02 — End: 2015-09-02
  Filled 2015-09-02: qty 16

## 2015-09-02 MED ORDER — HYDROXYZINE HCL 25 MG PO TABS: 25.0000 mg | ORAL_TABLET | Freq: Four times a day (QID) | ORAL | Status: DC | PRN

## 2015-09-02 MED ORDER — PANTOPRAZOLE SODIUM 40 MG PO TBEC
40.0000 mg | DELAYED_RELEASE_TABLET | Freq: Every day | ORAL | Status: DC
Start: 2015-09-02 — End: 2015-09-02
  Administered 2015-09-02: 40 mg via ORAL
  Filled 2015-09-02: qty 1

## 2015-09-02 MED ORDER — ACETAMINOPHEN 500 MG PO TABS: 1000.0000 mg | ORAL_TABLET | Freq: Four times a day (QID) | ORAL | Status: DC | PRN

## 2015-09-02 MED ORDER — HALOPERIDOL 5 MG PO TABS
5.0000 mg | ORAL_TABLET | Freq: Three times a day (TID) | ORAL | Status: DC
Start: 2015-09-02 — End: 2015-09-02
  Administered 2015-09-02 (×2): 5 mg via ORAL
  Filled 2015-09-02 (×2): qty 1

## 2015-09-02 MED ORDER — PALIPERIDONE PALMITATE 234 MG/1.5ML IM SUSP: 234.0000 mg | INTRAMUSCULAR | Status: DC

## 2015-09-02 MED ORDER — ZOLPIDEM TARTRATE 5 MG PO TABS: 5.0000 mg | ORAL_TABLET | Freq: Every day | ORAL | Status: DC

## 2015-09-02 MED ORDER — DIVALPROEX SODIUM ER 500 MG PO TB24
1000.0000 mg | ORAL_TABLET | Freq: Two times a day (BID) | ORAL | Status: DC
Start: 2015-09-02 — End: 2015-09-02
  Administered 2015-09-02: 1000 mg via ORAL
  Filled 2015-09-02: qty 2

## 2015-09-02 MED ORDER — LORAZEPAM 2 MG PO TABS: 2.0000 mg | ORAL_TABLET | Freq: Four times a day (QID) | ORAL | Status: DC | PRN

## 2015-09-02 MED ORDER — CYCLOBENZAPRINE HCL 10 MG PO TABS: 10.0000 mg | ORAL_TABLET | Freq: Three times a day (TID) | ORAL | Status: DC | PRN

## 2015-09-02 NOTE — ED Notes (Signed)
Lunch served
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
## 2015-09-02 NOTE — ED Provider Notes (Signed)
Patient cleared by [name] of psychiatry. Vital signs are normal. Appropriate for discharge.  Jene Everyobert Jaquez Farrington, MD 09/02/15 1537

## 2015-09-02 NOTE — ED Notes (Signed)
Patient will be discharged back to group home per [name]. Patient states that He is happy to go back and hopes they will pick him up soon.

## 2015-09-02 NOTE — ED Notes (Signed)
Patient with discharge orders, group home owner to pick him up and transport back to group home, all belongings given back to Patient. Patient without s/s of distress.

## 2015-09-02 NOTE — ED Notes (Signed)

## 2015-09-02 NOTE — ED Notes (Signed)
Patient taking shower.

## 2015-09-02 NOTE — ED Notes (Signed)
Patient without any behavioral issues, no s/s of distress. Patient is safe, denies Si/Hi. Patient with q 15 min. Checks.

## 2015-09-02 NOTE — ED Notes (Signed)
Patient is eating breakfast, no signs of distress, Patient with q 15 min.checks and camera surveillance. Patient is pleasant, mood appropriate.

## 2015-09-02 NOTE — ED Notes (Signed)
Report was received from Charles W., RN; Pt. Verbalizes no complaints or distress; denies S.I./Hi. Continue to monitor with 15 min. Monitoring. 
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
## 2015-09-02 NOTE — ED Notes (Signed)
Patient talking with Dr.Clapacs, patient is calm, no behavior issues.

## 2015-09-02 NOTE — ED Provider Notes (Signed)
-----------------------------------------   7:26 AM on 09/02/2015 -----------------------------------------   Blood pressure 106/51, pulse 100, temperature 98.4 F (36.9 C), temperature source Oral, resp. rate 14, height 5\' 10"  (1.778 m), weight 246 lb (111.585 kg), SpO2 99 %.  The patient had no acute events since last update.  Calm and cooperative at this time.  Disposition is pending per Psychiatry/Behavioral Medicine team recommendations.     Jeanmarie PlantJames A McShane, MD 09/02/15 [phone]

## 2015-09-02 NOTE — ED Notes (Signed)
Patient is lying in bed, nurse checked on him and He states that He is fine, no behavioral issues, patient wit q 15 min. Checks and camera surveillance.

## 2015-10-28 ENCOUNTER — Emergency Department
Admission: EM | Admit: 2015-10-28 | Discharge: 2015-11-18 | Disposition: A | Discharge status: Home / Self Care | Payer: Medicare Other | Attending: Emergency Medicine | Admitting: Emergency Medicine

## 2015-10-28 DIAGNOSIS — Z79899 Other long term (current) drug therapy: Secondary | ICD-10-CM | POA: Diagnosis not present

## 2015-10-28 DIAGNOSIS — F39 Unspecified mood [affective] disorder: Secondary | ICD-10-CM | POA: Diagnosis not present

## 2015-10-28 DIAGNOSIS — F209 Schizophrenia, unspecified: Secondary | ICD-10-CM | POA: Diagnosis present

## 2015-10-28 DIAGNOSIS — F918 Other conduct disorders: Secondary | ICD-10-CM | POA: Diagnosis present

## 2015-10-28 DIAGNOSIS — F4325 Adjustment disorder with mixed disturbance of emotions and conduct: Secondary | ICD-10-CM

## 2015-10-28 DIAGNOSIS — R4689 Other symptoms and signs involving appearance and behavior: Secondary | ICD-10-CM | POA: Diagnosis present

## 2015-10-28 DIAGNOSIS — R451 Restlessness and agitation: Secondary | ICD-10-CM | POA: Diagnosis not present

## 2015-10-28 LAB — URINE DRUG SCREEN, QUALITATIVE (ARMC ONLY)
Amphetamines, Ur Screen: NOT DETECTED
Barbiturates, Ur Screen: NOT DETECTED
Benzodiazepine, Ur Scrn: POSITIVE — AB
COCAINE METABOLITE, UR ~~LOC~~: NOT DETECTED
Cannabinoid 50 Ng, Ur ~~LOC~~: NOT DETECTED
MDMA (ECSTASY) UR SCREEN: NOT DETECTED
METHADONE SCREEN, URINE: NOT DETECTED
Opiate, Ur Screen: NOT DETECTED
Phencyclidine (PCP) Ur S: NOT DETECTED
TRICYCLIC, UR SCREEN: POSITIVE — AB

## 2015-10-28 LAB — CBC
HCT: 43.3 % (ref 40.0–52.0)
HEMOGLOBIN: 15.2 g/dL (ref 13.0–18.0)
MCH: 32.1 pg (ref 26.0–34.0)
MCHC: 35 g/dL (ref 32.0–36.0)
MCV: 91.6 fL (ref 80.0–100.0)
Platelets: 219 10*3/uL (ref 150–440)
RBC: 4.73 MIL/uL (ref 4.40–5.90)
RDW: 12.8 % (ref 11.5–14.5)
WBC: 7.3 10*3/uL (ref 3.8–10.6)

## 2015-10-28 LAB — COMPREHENSIVE METABOLIC PANEL
ALBUMIN: 4 g/dL (ref 3.5–5.0)
ALT: 73 U/L — AB (ref 17–63)
AST: 47 U/L — ABNORMAL HIGH (ref 15–41)
Alkaline Phosphatase: 73 U/L (ref 38–126)
Anion gap: 9 (ref 5–15)
BUN: 10 mg/dL (ref 6–20)
CHLORIDE: 105 mmol/L (ref 101–111)
CO2: 24 mmol/L (ref 22–32)
CREATININE: 0.59 mg/dL — AB (ref 0.61–1.24)
Calcium: 9.2 mg/dL (ref 8.9–10.3)
GFR calc non Af Amer: >60 mL/min (ref >60)
GLUCOSE: 119 mg/dL — AB (ref 65–99)
Potassium: 3.7 mmol/L (ref 3.5–5.1)
SODIUM: 138 mmol/L (ref 135–145)
Total Bilirubin: 0.5 mg/dL (ref 0.3–1.2)
Total Protein: 7.7 g/dL (ref 6.5–8.1)

## 2015-10-28 LAB — ACETAMINOPHEN LEVEL

## 2015-10-28 LAB — SALICYLATE LEVEL

## 2015-10-28 LAB — ETHANOL: Alcohol, Ethyl (B): <5 mg/dL (ref <5)

## 2015-10-28 MED ORDER — NAPROXEN 500 MG PO TABS
500.0000 mg | ORAL_TABLET | Freq: Once | ORAL | Status: AC
Start: 2015-10-28 — End: 2015-10-28
  Administered 2015-10-28: 500 mg via ORAL

## 2015-10-28 MED ORDER — NAPROXEN 500 MG PO TABS
ORAL_TABLET | ORAL | Status: AC
  Administered 2015-10-28: 500 mg via ORAL
  Filled 2015-10-28: qty 1

## 2015-10-28 MED ORDER — NAPROXEN 375 MG PO TABS: 375.0000 mg | ORAL_TABLET | Freq: Once | ORAL | Status: DC
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
## 2015-10-28 NOTE — ED Notes (Addendum)
The patient was dressed out into the purple scrubs for a second time today. His belongings were placed into a white patient belongings bag and then given to the [employer]. The patient is now back on hallway 20 bed. No incidents during the dress out time.

## 2015-10-28 NOTE — ED Notes (Signed)
[place] MD at bedside  

## 2015-10-28 NOTE — ED Notes (Signed)
This RN called group [facility]" per facility they need to get in contact with the pts guardian. Unsure of when they will be able to send someone to pickup pt.  Pt waiting calmly in hallway bed. In NAD
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
## 2015-10-28 NOTE — Discharge Instructions (Signed)
Take anti-inflammatories as needed for the foot pain. Please contact your primary care physician and return to the ED should she have any thoughts of harming herself or others.
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
## 2015-10-28 NOTE — ED Notes (Addendum)
Pts guardians [name] [name] Ped number [phone] extension [phone]

## 2015-10-28 NOTE — ED Notes (Signed)
This RN attempted to call pts father with pts permission. Unable to reach at this time

## 2015-10-29 DIAGNOSIS — F39 Unspecified mood [affective] disorder: Secondary | ICD-10-CM | POA: Diagnosis not present

## 2015-10-29 LAB — VALPROIC ACID LEVEL: VALPROIC ACID LVL: 28 ug/mL — AB (ref 50.0–100.0)

## 2015-10-29 MED ORDER — ZOLPIDEM TARTRATE 5 MG PO TABS
5.0000 mg | ORAL_TABLET | Freq: Every evening | ORAL | Status: DC | PRN
  Administered 2015-11-02 – 2015-11-04 (×3): 5 mg via ORAL
  Filled 2015-10-29 (×2): qty 1

## 2015-10-29 MED ORDER — VITAMIN D 1000 UNITS PO TABS
1000.0000 [IU] | ORAL_TABLET | Freq: Every day | ORAL | Status: DC
  Administered 2015-10-29 – 2015-11-06 (×9): 1000 [IU] via ORAL
  Filled 2015-10-29 (×7): qty 1

## 2015-10-29 MED ORDER — DIVALPROEX SODIUM 500 MG PO DR TAB
1000.0000 mg | DELAYED_RELEASE_TABLET | Freq: Two times a day (BID) | ORAL | Status: DC
  Administered 2015-10-29 – 2015-11-05 (×15): 1000 mg via ORAL
  Filled 2015-10-29 (×12): qty 2

## 2015-10-29 MED ORDER — AQUAPHOR EX OINT
TOPICAL_OINTMENT | Freq: Two times a day (BID) | CUTANEOUS | Status: DC | PRN
  Administered 2015-11-13: 1 via TOPICAL
  Filled 2015-10-29: qty 50

## 2015-10-29 MED ORDER — HALOPERIDOL 5 MG PO TABS
5.0000 mg | ORAL_TABLET | Freq: Three times a day (TID) | ORAL | Status: DC
  Administered 2015-10-29 – 2015-11-06 (×24): 5 mg via ORAL
  Filled 2015-10-29 (×19): qty 1

## 2015-10-29 MED ORDER — NAPROXEN 500 MG PO TABS
ORAL_TABLET | ORAL | Status: AC
  Administered 2015-10-29: 500 mg via ORAL
  Filled 2015-10-29: qty 1

## 2015-10-29 MED ORDER — ACETAMINOPHEN 500 MG PO TABS: 500.0000 mg | ORAL_TABLET | Freq: Once | ORAL | Status: DC

## 2015-10-29 MED ORDER — LORAZEPAM 2 MG PO TABS
2.0000 mg | ORAL_TABLET | Freq: Three times a day (TID) | ORAL | Status: DC
  Administered 2015-10-29 – 2015-11-05 (×24): 2 mg via ORAL
  Filled 2015-10-29 (×19): qty 1

## 2015-10-29 MED ORDER — NAPROXEN 500 MG PO TABS
500.0000 mg | ORAL_TABLET | Freq: Once | ORAL | Status: AC
  Administered 2015-10-29: 500 mg via ORAL

## 2015-10-29 MED ORDER — DIVALPROEX SODIUM 500 MG PO DR TAB
500.0000 mg | DELAYED_RELEASE_TABLET | Freq: Once | ORAL | Status: DC
  Filled 2015-10-29 (×2): qty 1

## 2015-10-29 MED ORDER — PANTOPRAZOLE SODIUM 40 MG PO TBEC
40.0000 mg | DELAYED_RELEASE_TABLET | Freq: Every day | ORAL | Status: DC
  Administered 2015-10-29 – 2015-11-06 (×9): 40 mg via ORAL
  Filled 2015-10-29 (×7): qty 1

## 2015-10-29 MED ORDER — ACETAMINOPHEN 500 MG PO TABS
500.0000 mg | ORAL_TABLET | Freq: Every day | ORAL | Status: DC | PRN
  Administered 2015-10-30 – 2015-11-18 (×15): 500 mg via ORAL
  Filled 2015-10-29 (×13): qty 1

## 2015-10-29 NOTE — ED Notes (Signed)

## 2015-10-29 NOTE — ED Notes (Signed)
Report was received from April B., RN; Pt. Verbalizes no complaints or distress; denies S.I./Hi. Continue to monitor with 15 min. Monitoring.

## 2015-10-29 NOTE — ED Notes (Signed)
Patient gave verbal OK to speak with his mother. Called Misty Stanley back, she wanted to get an update on patient placement. I informed her that the social worker was working on finding patient placement at this time. Misty Stanley verbalized understanding.
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
## 2015-10-29 NOTE — ED Notes (Signed)
Patient awake and ambulating in room without difficulty.

## 2015-10-29 NOTE — Progress Notes (Signed)
LCSW sent patient information Fl2 and CSW initial referral out via the HUB and will check through out the day to see if patient has been considered.

## 2015-10-29 NOTE — Progress Notes (Signed)
LCSW received call from [facility] and will consider this patient and call this worker once she consulted with group home owner. LCSW received call from Mr Tenny Craw- Patient was declined because he accepts males 30-30 years old. [phone]  LCSW spoke to [facility], they accept male beds only.  [employer] LCSW [phone]

## 2015-10-29 NOTE — ED Notes (Signed)
Patient taken to shower by Bernette Redbird, ED tech

## 2015-10-29 NOTE — ED Notes (Signed)
Social worker in with patient at this time.
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
## 2015-10-29 NOTE — Progress Notes (Signed)
Called [name] [name] [phone] she has male bed available. She is reviewing patient information and will be able to possibly meet with patient on Monday.  LCSW will leave this placement contact for weekday CSW to follow up with.

## 2015-10-29 NOTE — ED Notes (Signed)
Report to margaret, rn. Pt moved to bhu 

## 2015-10-30 DIAGNOSIS — F39 Unspecified mood [affective] disorder: Secondary | ICD-10-CM | POA: Diagnosis not present

## 2015-10-30 NOTE — ED Notes (Signed)
Patient resting quietly in room. No noted distress or abnormal behaviors noted. Will continue 15 minute checks and observation by security camera for safety. 
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
## 2015-10-30 NOTE — ED Notes (Signed)
Patient remains calm and cooperative. No complaints. Maintained on 15 minute checks and observation by security camera for safety.

## 2015-10-30 NOTE — ED Notes (Signed)
Quiet, no aggression. Maintained on 15 minute checks and observation by security camera for safety.

## 2015-10-30 NOTE — Progress Notes (Signed)
LCSW called Purvis Kiltsavid Humphries and advocated to review patient information. He will call this worker back today.  No other bed offers have been made at this time  LCSW called and left message for Hilbert OdorVanda Thomas Guardian that information has been sent out via the HUB and that Mr [name] [name] is considering the patient. Asked guardian to call back.  Delta Air LinesClaudine Eh Sauseda LCSW [phone]

## 2015-10-30 NOTE — ED Notes (Signed)
Patient asleep in room. No noted distress or abnormal behavior. Will continue 15 minute checks and observation by security cameras for safety. Meal tray and beverage left at bedside.

## 2015-10-30 NOTE — ED Notes (Signed)
Patient sitting quietly in dayroom.  Maintained on 15 minute checks and observation by security camera for safety. 

## 2015-10-30 NOTE — ED Provider Notes (Signed)
-----------------------------------------   7:12 AM on 10/30/2015 -----------------------------------------   Blood pressure (!) 144/89, pulse 100, temperature 97.8 F (36.6 C), temperature source Oral, resp. rate 16, height 5\' 10"  (1.778 m), weight 256 lb (116.1 kg), SpO2 98 %.  The patient had no acute events since last update.  Calm and cooperative at this time.  Disposition is pending per Psychiatry/Behavioral Medicine team recommendations.     Irean HongJade J Sung, MD 10/30/15 [phone]

## 2015-10-30 NOTE — ED Notes (Signed)
Patient given lunch tray.

## 2015-10-30 NOTE — ED Notes (Signed)
Report was received by from Rudy JewWendy L., RN; Pt. Verbalizes no complaints or distress; denies S.I./Hi. Continue to monitor with 15 min. Monitoring.

## 2015-10-30 NOTE — ED Notes (Signed)
Patient received meal tray 

## 2015-10-30 NOTE — ED Notes (Signed)
Patient awake, alert, and oriented. He denies SI or HI. No aggression. Patient is aware he cannot return to group home. SW working on placement.Maintained on 15 minute checks and observation by security camera for safety.

## 2015-10-30 NOTE — ED Notes (Signed)

## 2015-10-30 NOTE — Progress Notes (Signed)
LCSW received call from Ms Davis,[phone] [facility] and they are interested in meeting the patient either Monday afternoon or Wednesday afternoon. LCSW explained that Ms Earlene PlaterDavis would need to speak to the patients guardian and confirm his placement suitability.  Called Forrest Moronavid Humphreys and left another voice mail message.   LCSW will provide full time ED social worker with contact information to [facility].   Delta Air LinesClaudine Tobi Groesbeck LCSW [phone]

## 2015-10-31 DIAGNOSIS — F39 Unspecified mood [affective] disorder: Secondary | ICD-10-CM | POA: Diagnosis not present
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
## 2015-10-31 NOTE — Progress Notes (Signed)
CS[facility] with [facility]. [facility] is in the area and will be over to visit pt shortly. RN notified.   Jonathon JordanLynn B Francina Beery, MSW, Theresia MajorsLCSWA [phone]

## 2015-10-31 NOTE — ED Notes (Signed)
Received report from Ruth, RN at this time. 

## 2015-10-31 NOTE — ED Notes (Signed)
Pt is aware he is waiting for a new group home

## 2015-10-31 NOTE — ED Notes (Signed)
Patient is alert and oriented in no apparent distress. Patient is calmly lying in bed watching cartoons on television. Patient denies SI, HI. Patient is encouraged to let nursing staff know of any concerns or needs.Will continue 15 minute checks and observation by security camera for safety.

## 2015-10-31 NOTE — ED Notes (Signed)
Sandwich and soft drink given.  

## 2015-10-31 NOTE — ED Notes (Signed)
EKG performed at this time.Patient remained calm and cooperative in no apparent distress during procedure.

## 2015-10-31 NOTE — ED Notes (Signed)
Left ed dr a note he was very busy to inform him of pts c/o and pulse rate also notified Dr Toni Amendlapacs . Pt does not appear in acute distress will follow up with next shift to notify ed Dr again .

## 2015-10-31 NOTE — ED Notes (Signed)
This RN notified Dr. Fanny BienQuale regarding pulse rate taken at shift change and rate over several days at this time

## 2015-10-31 NOTE — ED Provider Notes (Signed)
-----------------------------------------   1:30 AM on 10/31/2015 -----------------------------------------   Blood pressure 125/81, pulse (!) 104, temperature 98 F (36.7 C), temperature source Oral, resp. rate 20, height 5\' 10"  (1.778 m), weight 256 lb (116.1 kg), SpO2 98 %.  The patient had no acute events since last update.  Calm and cooperative at this time.  Disposition is pending per Psychiatry/Behavioral Medicine team recommendations.     Jennye MoccasinBrian S Quigley, MD 10/31/15 0130

## 2015-11-01 DIAGNOSIS — F39 Unspecified mood [affective] disorder: Secondary | ICD-10-CM | POA: Diagnosis not present

## 2015-11-01 MED ORDER — TUBERCULIN PPD 5 UNIT/0.1ML ID SOLN
5.0000 [IU] | Freq: Once | INTRADERMAL | Status: AC
  Administered 2015-11-01: 5 [IU] via INTRADERMAL
  Filled 2015-11-01 (×2): qty 0.1

## 2015-11-01 NOTE — ED Notes (Signed)
Pt. Alert and oriented, warm and dry, in no distress. Pt. Denies SI, HI, and AVH. Pt states, "My anxiety has been better today, it was a lot less." Pt. Encouraged to let nursing staff know of any concerns or needs.

## 2015-11-01 NOTE — ED Notes (Signed)
Pt is still intrested in detox obs in [place] denied him, RT[facility] will not accept due to insurance  , tts working  On arca at this time

## 2015-11-01 NOTE — ED Notes (Signed)
ED BHU PLACEMENT JUSTIFICATION Is the patient under IVC or is there intent for IVC: No. Is the patient medically cleared: Yes.   Is there vacancy in the ED BHU: Yes.   Is the population mix appropriate for patient: Yes.   Is the patient awaiting placement in inpatient or outpatient setting: Yes.   Has the patient had a psychiatric consult: Yes.   Survey of unit performed for contraband, proper placement and condition of furniture, tampering with fixtures in bathroom, shower, and each patient room: Yes.  ; Findings: NA APPEARANCE/BEHAVIOR calm, cooperative and adequate rapport can be established NEURO ASSESSMENT Orientation: time, place and person Hallucinations: No.None noted (Hallucinations) Speech: Normal Gait: normal RESPIRATORY ASSESSMENT Normal expansion.  Clear to auscultation.  No rales, rhonchi, or wheezing. CARDIOVASCULAR ASSESSMENT regular rate and rhythm, S1, S2 normal, no murmur, click, rub or gallop GASTROINTESTINAL ASSESSMENT soft, nontender, BS WNL, no r/g EXTREMITIES normal strength, tone, and muscle mass PLAN OF CARE Provide calm/safe environment. Vital signs assessed twice daily. ED BHU Assessment once each 12-hour shift. Collaborate with intake RN daily or as condition indicates. Assure the ED provider has rounded once each shift. Provide and encourage hygiene. Provide redirection as needed. Assess for escalating behavior; address immediately and inform ED provider.  Assess family dynamic and appropriateness for visitation as needed: Yes.  ; If necessary, describe findings: NA Educate the patient/family about BHU procedures/visitation: Yes.  ; If necessary, describe findings: NA  

## 2015-11-01 NOTE — Progress Notes (Signed)
CSW consulted with supervisors. CSW was instructed that pt is no longer appropriate for ED level of care and the guardian should be notified that pt should be d/c'd from hospital by 3pm today. From there legal guardian should arrange placement.   CSW called legal guardian Hazle CocaVonda Thomas [phone]. No answer. CSW left a message on Vonda's confidential voicemail that pt is to be d/c'd today and guardian should arrange accommodations. Awaiting call back.  CSW called [facility] [employer] [employer] guardianship crisis line [phone] and spoke with Cassandra. CSW informed Cassandra that pt will need to be d/c'd by 3pm today. CSW also reported that it would be difficult for the hospital to find placement for pt due to his pending charges. Cassandra states that she understands the hospital's position, however, [employer] does not have the ability to bring pt home with them as they are a court appointed guardian. CSW expressed her understanding and stated that something would need to be figured out as pt can no longer remain in the hospital. Elonda HuskyCassandra stated she would call CSW back when she got to her office in about an hour. Awaiting call back.  Jonathon JordanLynn B Dsean Vantol, MSW, Theresia MajorsLCSWA [phone]

## 2015-11-01 NOTE — ED Notes (Signed)
Sandwich and soft drink given.  

## 2015-11-01 NOTE — ED Notes (Signed)
Report given to Windell Mouldinguth, RN at this time.

## 2015-11-01 NOTE — ED Notes (Signed)
Patient remains calm and cooperative. No issues to report this shift. Maintained on 15 minute checks and observation by security camera for safety.

## 2015-11-01 NOTE — Consult Note (Signed)
[facility] Face-to-Face Psychiatry Consult   Reason for Consult:  Consult for 30 year old man with a history of chronic mental health issues who came here voluntarily after being aggressive at home. Referring Physician:  [name] Patient Identification: [name] [name] MRN:  [id number] Principal Diagnosis: Adjustment disorder with mixed disturbance of emotions and conduct Diagnosis:   Patient Active Problem List   Diagnosis Date Noted  . Broken finger [S62.609A] 06/09/2015  . Gastric reflux syndrome [K21.9] 04/05/2015  . Undifferentiated schizophrenia (HCC) [F20.3]   . Schizophrenia (HCC) [F20.9] 01/18/2015  . Aggressive behavior [F60.89] 11/25/2013  . Adjustment disorder with mixed disturbance of emotions and conduct [F43.25] 10/19/2013    Total Time spent with patient: 20 minutes  Subjective:   [name] [name] is a 30 y.o. male patient admitted with "I got mad at a staff". Follow-up for Tuesday the eighth. Patient has no new behavior problems. No new complaints. He is cooperative with treatment here in the emergency room. He understands that we are currently seeking placement.   F HPI:  Patient interviewed. Chart reviewed. Labs and vitals reviewed. Patient known from previous encounters. He is here voluntarily without commitment papers. He came in because he got aggressive at his group home. He says that he was at his group home going about the usual daily activities but there was a staff member there who he does not like. He indicates that staff member seem to be speaking to him disrespectfully or treating him badly somehow. Patient admits that he threw a chair across the room and call this individual by a obscene name. Patient says now that he regrets doing it. He says overall his mood has been pretty good recently. He is quite proud that he's had some extended periods of time without agitated incidents recently. Denies any wish to harm anyone or harm himself. Denies that he's having  psychotic symptoms. He says he is compliant with his prescribed medicine. He is not aware of any recent changes. Denies any alcohol or drug abuse. As usual he complains that the group home is unsatisfactory. He doesn't like it in general and doesn't like some of the staff members and he is a think often frustrated at the end of the week because he wishes he could be with his family again.  Substance abuse history: No alcohol or drug abuse no past history of alcohol or drug abuse.  Social history: Patient has a legal guardian. His relatives are enhanced and a grandfather. Parents are not really involved. [place] Currently living in [place] group home in [place] county.  Medical history: No major significant ongoing medical problems.  Past Psychiatric History: Long-standing history of mental illness. Diagnosis of schizophrenia. Patient has some aspects to him that suggest probably a degree of underlying autism as well. He has been compliant with medicines and has not been exhibiting psychotic symptoms so much as impulsive aggression intermittently. Past history of hospitalizations. Had several emergency room visits similar in nature to this one  Risk to Self: Is patient at risk for suicide?: No Risk to Others:   Prior Inpatient Therapy:   Prior Outpatient Therapy:    Past Medical History:  Past Medical History:  Diagnosis Date  . Anxiety   . Bipolar affective disorder (HCC)   . Depression   . Fatty liver   . Schizophrenia (HCC)    History reviewed. No pertinent surgical history. Family History: No family history on file.  Family Psychiatric  History: No known family history of mental illness Social History:  History  Alcohol Use No     History  Drug Use No    Social History   Social History  . Marital status: Single    Spouse name: N/A  . Number of children: N/A  . Years of education: N/A   Social History Main Topics  .  Smoking status: Never Smoker  . Smokeless tobacco: Never Used  . Alcohol use No  . Drug use: No  . Sexual activity: Not Asked   Other Topics Concern  . None   Social History Narrative  . None   Additional Social History:    Allergies:   Allergies  Allergen Reactions  . Ibuprofen Other (See Comments)    GI distress   . Zoloft [Sertraline Hcl] Other (See Comments)    Pt reports increased irritability, hallucinations when taking medication    Labs:  No results found for this or any previous visit (from the past 48 hour(s)).  Current Facility-Administered Medications  Medication Dose Route Frequency Provider Last Rate Last Dose  . acetaminophen (TYLENOL) tablet 500 mg  500 mg Oral Daily PRN Jennye MoccasinBrian S Quigley, MD   500 mg at 10/31/15 2124  . cholecalciferol (VITAMIN D) tablet 1,000 Units  1,000 Units Oral Daily Jennye MoccasinBrian S Quigley, MD   1,000 Units at 11/01/15 0935  . divalproex (DEPAKOTE) DR tablet 1,000 mg  1,000 mg Oral BID Jennye MoccasinBrian S Quigley, MD   1,000 mg at 11/01/15 0935  . haloperidol (HALDOL) tablet 5 mg  5 mg Oral TID Jennye MoccasinBrian S Quigley, MD   5 mg at 11/01/15 0935  . LORazepam (ATIVAN) tablet 2 mg  2 mg Oral TID Jennye MoccasinBrian S Quigley, MD   2 mg at 11/01/15 0935  . mineral oil-hydrophilic petrolatum (AQUAPHOR) ointment   Topical BID PRN Jennye MoccasinBrian S Quigley, MD      . pantoprazole (PROTONIX) EC tablet 40 mg  40 mg Oral Daily Jennye MoccasinBrian S Quigley, MD   40 mg at 11/01/15 1348  . tuberculin injection 5 Units  5 Units Intradermal Once Audery AmelJohn T Reyes Aldaco, MD      . zolpidem (AMBIEN) tablet 5 mg  5 mg Oral QHS PRN Jennye MoccasinBrian S Quigley, MD       Current Outpatient Prescriptions  Medication Sig Dispense Refill  . acetaminophen (TYLENOL) 500 MG tablet Take 1,000 mg by mouth every 6 (six) hours as needed for moderate pain.    . Cholecalciferol (VITAMIN D3) 10000 units TABS Take 1 tablet by mouth daily.    . divalproex (DEPAKOTE ER) 500 MG 24 hr tablet Take 1,000 mg by mouth 2 (two) times daily.    . haloperidol  (HALDOL) 5 MG tablet Take 5 mg by mouth 3 (three) times daily.    Marland Kitchen. LORazepam (ATIVAN) 2 MG tablet Take 2 mg by mouth every 6 (six) hours as needed (for agitation).    . mineral oil-hydrophilic petrolatum (AQUAPHOR) ointment Apply 1 application topically 2 (two) times daily as needed for dry skin.    . pantoprazole (PROTONIX) 40 MG tablet Take 1 tablet (40 mg total) by mouth daily. 30 tablet 0  . zolpidem (AMBIEN) 5 MG tablet Take 5 mg by mouth at bedtime.    . cyclobenzaprine (FLEXERIL) 10 MG tablet Take 10 mg by mouth 3 (three) times daily as needed for muscle spasms.    . paliperidone (INVEGA SUSTENNA) 234 MG/1.5ML SUSP injection Inject 234 mg into the muscle every 28 (  twenty-eight) days. To be given on the 22nd day of the month      Musculoskeletal: Strength & Muscle Tone: within normal limits Gait & Station: normal Patient leans: N/A  Psychiatric Specialty Exam: Physical Exam  Nursing note and vitals reviewed. Constitutional: He appears well-developed and well-nourished.  HENT:  Head: Normocephalic and atraumatic.  Eyes: Conjunctivae are normal. Pupils are equal, round, and reactive to light.  Neck: Normal range of motion.  Cardiovascular: Regular rhythm and normal heart sounds.   Respiratory: Effort normal. No respiratory distress.  GI: Soft.  Musculoskeletal: Normal range of motion.  Neurological: He is alert.  Skin: Skin is warm and dry.  Psychiatric: His affect is blunt. His speech is delayed. He is slowed. Cognition and memory are normal. He expresses impulsivity. He expresses no homicidal and no suicidal ideation.    Review of Systems  Constitutional: Negative.   HENT: Negative.   Eyes: Negative.   Respiratory: Negative.   Cardiovascular: Negative.   Gastrointestinal: Negative.   Musculoskeletal: Negative.   Skin: Negative.   Neurological: Negative.   Psychiatric/Behavioral: Negative for depression, hallucinations, memory loss, substance abuse and suicidal ideas.  The patient is nervous/anxious. The patient does not have insomnia.     Blood pressure 126/84, pulse 100, temperature 98.2 F (36.8 C), temperature source Oral, resp. rate 16, height 5\' 10"  (1.778 m), weight 116.1 kg (256 lb), SpO2 97 %.Body mass index is 36.73 kg/m.  General Appearance: Casual  Eye Contact:  Fair  Speech:  Normal Rate  Volume:  Decreased  Mood:  Euthymic  Affect:  Constricted  Thought Process:  Goal Directed  Orientation:  Full (Time, Place, and Person)  Thought Content:  Logical  Suicidal Thoughts:  No  Homicidal Thoughts:  No  Memory:  Immediate;   Good Recent;   Fair Remote;   Fair  Judgement:  Fair  Insight:  Fair  Psychomotor Activity:  Decreased  Concentration:  Concentration: Fair  Recall:  [employer] of Knowledge:  Fair  Language:  Fair  Akathisia:  No  Handed:  Right  AIMS (if indicated):     Assets:  Communication Skills Desire for Improvement Financial Resources/Insurance Housing Physical Health Social Support  ADL's:  Intact  Cognition:  Impaired,  Mild  Sleep:        Treatment Plan Summary: Plan Supportive counseling. No change to medication. We are working on placement and he has already interviewed with one or 2 group homes.  Disposition: Patient does not meet criteria for psychiatric inpatient admission. Supportive therapy provided about ongoing stressors.  Mordecai Rasmussen, MD 11/01/2015 5:24 PM

## 2015-11-01 NOTE — ED Notes (Signed)
Pt c/o of being bored, given activity books

## 2015-11-01 NOTE — ED Provider Notes (Signed)
-----------------------------------------   6:56 AM on 11/01/2015 -----------------------------------------   Blood pressure 126/84, pulse 100, temperature 98.2 F (36.8 C), temperature source Oral, resp. rate 16, height 5\' 10"  (1.778 m), weight 256 lb (116.1 kg), SpO2 97 %.  The patient had no acute events since last update.  Calm and cooperative at this time.  Disposition is pending per Psychiatry/Behavioral Medicine team recommendations.     Irean HongJade J Makalya Nave, MD 11/01/15 [phone]

## 2015-11-01 NOTE — ED Notes (Signed)

## 2015-11-01 NOTE — ED Provider Notes (Signed)
EKG interpreted by me Sinus tachycardia rate 120. Normal axis intervals QRS ST segments and T waves.   Sharman CheekPhillip Jacson Rapaport, MD 11/01/15 [phone]

## 2015-11-01 NOTE — Progress Notes (Signed)
CSW [facility] Shavonda [phone]. Darrel HooverShavonda states that she would be willing to assist with placement, however, she needs pt's FL2. CSW stated that [facility] but provider can call Medical [facility] to try to obtain the FL2. CSW also explained to care coordinator that pt would need to be d/c'd by today. Darrel HooverShavonda expressed her understanding.  Jonathon JordanLynn B Takari Duncombe, MSW, Theresia MajorsLCSWA [phone]

## 2015-11-01 NOTE — Progress Notes (Addendum)
CSW called [name] [name] [phone] to follow up on visit she had with [name] [name] on Monday. [name] [name] states she believes [name] [name] may be a good fit and would be available to pick [name] [name] up on Thursday. [name] [name] needs a TB test for [name] [name] before she can accept him. RN notified.  CSW called [name] [name] [phone] to follow up on [name] [name] referral. No answer, and voicemail is not currently set up. Will try again later.   [name] [name], [name] [name], [name] MajorsLCSWA [phone]

## 2015-11-01 NOTE — ED Notes (Signed)
Pt now c/o again of palpitations Dr Scotty CourtStafford notified ekg ordered

## 2015-11-02 DIAGNOSIS — F39 Unspecified mood [affective] disorder: Secondary | ICD-10-CM | POA: Diagnosis not present

## 2015-11-02 LAB — TSH: TSH: 4.345 u[IU]/mL (ref 0.350–4.500)

## 2015-11-02 LAB — COMPREHENSIVE METABOLIC PANEL
ALK PHOS: 65 U/L (ref 38–126)
ALT: 102 U/L — AB (ref 17–63)
AST: 59 U/L — ABNORMAL HIGH (ref 15–41)
Albumin: 3.8 g/dL (ref 3.5–5.0)
Anion gap: 9 (ref 5–15)
BILIRUBIN TOTAL: 0.7 mg/dL (ref 0.3–1.2)
BUN: 15 mg/dL (ref 6–20)
CALCIUM: 9.3 mg/dL (ref 8.9–10.3)
CO2: 26 mmol/L (ref 22–32)
CREATININE: 0.63 mg/dL (ref 0.61–1.24)
Chloride: 104 mmol/L (ref 101–111)
Glucose, Bld: 133 mg/dL — ABNORMAL HIGH (ref 65–99)
Potassium: 3.8 mmol/L (ref 3.5–5.1)
Sodium: 139 mmol/L (ref 135–145)
TOTAL PROTEIN: 7.4 g/dL (ref 6.5–8.1)

## 2015-11-02 LAB — CBC WITH DIFFERENTIAL/PLATELET
BASOS PCT: 1 %
Basophils Absolute: 0 10*3/uL (ref 0–0.1)
EOS ABS: 0 10*3/uL (ref 0–0.7)
Eosinophils Relative: 1 %
HCT: 43.8 % (ref 40.0–52.0)
HEMOGLOBIN: 15.3 g/dL (ref 13.0–18.0)
Lymphocytes Relative: 30 %
Lymphs Abs: 1.5 10*3/uL (ref 1.0–3.6)
MCH: 32.1 pg (ref 26.0–34.0)
MCHC: 35 g/dL (ref 32.0–36.0)
MCV: 91.6 fL (ref 80.0–100.0)
MONOS PCT: 14 %
Monocytes Absolute: 0.7 10*3/uL (ref 0.2–1.0)
NEUTROS PCT: 54 %
Neutro Abs: 2.9 10*3/uL (ref 1.4–6.5)
Platelets: 210 10*3/uL (ref 150–440)
RBC: 4.78 MIL/uL (ref 4.40–5.90)
RDW: 12.4 % (ref 11.5–14.5)
WBC: 5.2 10*3/uL (ref 3.8–10.6)

## 2015-11-02 LAB — FIBRIN DERIVATIVES D-DIMER (ARMC ONLY): FIBRIN DERIVATIVES D-DIMER (ARMC): 140 (ref 0–499)

## 2015-11-02 LAB — TROPONIN I

## 2015-11-02 NOTE — ED Notes (Signed)
Patient is alert, He ask nurse to come to his room and He want to tell nurse something personal, He told nurse that He was hearing a voice that He was going to be here until Christmas, Patient was teary eyed, patient also states that He would like to go outside and get some air, and that it was too hot in here.Nurse talked to Patient about options and that His case was being worked on and that He would hopefully soon find somewhere else to live. Patient is safe, denies Si/hi at this time.

## 2015-11-02 NOTE — ED Notes (Signed)
Patient alert and oriented, sitting in dayroom, He is kind, cooperative, and pleasant, Patient is very appreciative of all care, Patient with q 15 min.checks and camera monitoring in progress.

## 2015-11-02 NOTE — ED Notes (Signed)
Patient is taking a shower.

## 2015-11-02 NOTE — ED Notes (Signed)
Nurse did notifiy Dr. Tora DuckQuigly regarding increased heart rate, Md states that He would look into it, ask if Patient was having any chest discomfort, patient denies chest discomfort, will continue to monitor.

## 2015-11-02 NOTE — ED Provider Notes (Addendum)
-----------------------------------------   7:28 AM on 11/02/2015 -----------------------------------------   Blood pressure 128/64, pulse (!) 111, temperature 98.2 F (36.8 C), temperature source Oral, resp. rate 18, height 5\' 10"  (1.778 m), weight 256 lb (116.1 kg), SpO2 100 %.  The patient had no acute events since last update.  Calm and cooperative at this time.  Disposition is pending per Psychiatry/Behavioral Medicine team recommendations.  Patient has been noted in the BHU area area to be tachycardic. Source is unknown at this time. Patient's afebrile and I repeated laboratory work here including a D-dimer test, TSH, electrolytes, etc. with no obvious explanation of his tachycardia and the patient otherwise has no physical complaints. This may just simply be baseline for the patient were he may be slightly dehydrated.  ED ECG REPORT I, Jennye MoccasinBrian S Kieran Nachtigal, the attending physician, personally viewed and interpreted this ECG.  Date: 11/02/2015 EKG Time: *1010 Rate: 125 Rhythm: Sinus tachycardia without ectopy QRS Axis: normal Intervals: normal ST/T Wave abnormalities: normal Conduction Disturbances: none Narrative Interpretation: unremarkable Otherwise normal EKG   Jennye MoccasinBrian S Cranston Koors, MD 11/02/15 [id number]    Jennye MoccasinBrian S Masyn Rostro, MD 11/02/15 1438

## 2015-11-02 NOTE — Progress Notes (Addendum)
CSW contacted legal guardian Hazle CocaVonda Thomas with [employer] [employer] [phone] to update her on pt's status. CSW stated that the plan was still to have pt d/c as soon as possible. Ms. Maisie Fushomas became very confrontational and stated that [facility] planned to d/c pt to the streets. CSW carefully corrected Ms. Maisie Fushomas and stated that pt would not be d/c'd to the streets and the hospital never stated such. The plan is simply to release pt into guardian's care or placement as soon as possible as pt has been both psychiatrically and medically cleared. Ms. Maisie Fushomas refused to accept this explanation. She also reports that [facility] [employer] needs a letter stating that pt has been psychiatrically and medically cleared. CSW instructed Waldron SessionVonda to contact Medical Records because there are several notes from various providers stating pt's psychiatric and medical condition in the chart. Ms. Maisie Fushomas again became very confrontational and terminated the phone call while CSW was still speaking. CSW will continue to work on pt's case and call Ms. Thomas later today with updates.  CSW continues to work on placement for pt. CSW contacted Willaim RayasMary Thomas [phone] with [facility] and let her know that per RN, a TB test was placed on pt yesterday. Ms. Maisie Fushomas states that she will be able to pick pt up tomorrow however, is unable to confirm the time at this moment. Ms. Maisie Fushomas will notify CSW of the time for pick-up by tomorrow morning. Legal guardian has been notified via confidential voicemail at [phone].  CSW will continue to follow pt and assist as needed.   Jonathon JordanLynn B Marrisa Kimber, MSW, Theresia MajorsLCSWA [phone]

## 2015-11-02 NOTE — ED Notes (Signed)
Patient up to the bathroom, breakfast tray given to patient when He returned, patient is polite, and cooperative.

## 2015-11-02 NOTE — ED Notes (Signed)
Patient is oriented, He is pleasant, denies Si/hi or avh, patient is cooperative, no signs of distress, Patient is safe, q 15 min. Checks and camera surveillance in progress.

## 2015-11-02 NOTE — ED Notes (Signed)
Patient sitting in dayroom, no signs of distress, patient oriented, is very polite, He is safe, q 15 min. Checks and camera monitoring in progress.

## 2015-11-02 NOTE — Progress Notes (Signed)
CSW was contacted by Willaim RayasMary Thomas from [facility] [phone]. Ms. Maisie Fushomas informed CSW that legal guardian, Waldron SessionVonda, contacted her and gave her additional information about pt's aggressive behaviors and past. [facility] no [facility].   CSW contacted Waldron SessionVonda [phone] to get clarification about the situation and develop a new plan for the pt. No answer, CSW left message.  CSW contacted Ms. Earlene PlaterDavis with [facility] [phone] to see if pt would still be able to be considered for available bed. No answer, voicemail is not set up at this time. CSW will try again tomorrow.  Jonathon JordanLynn B Lessie Manigo, MSW, Theresia MajorsLCSWA [phone]

## 2015-11-02 NOTE — Consult Note (Signed)
  Psychiatry: Brief follow-up with this 30 year old man awaiting placement. No new complaints. No acute behavior problems. Physically appears to be stable. Tolerating medication well. Patient is not acutely psychiatrically ill but is awaiting placement by social work and guardian.

## 2015-11-02 NOTE — ED Notes (Signed)
Patient is alert and oriented, Patient without signs of distress, will continue to monitor, q 15 min. Checks and camera monitoring in progress.

## 2015-11-02 NOTE — ED Notes (Signed)
Patient is without evidence of distress, He is safe, q 15 min. Checks, and camera monitoring in progress.

## 2015-11-02 NOTE — ED Notes (Signed)
Patient is alert and oriented, speaks in low quiet tones, Patient is cooperative, no evidence of distress. Patient sitting in dayroom, patient is safe. q 15 min. Checks and camera monitoring in progress.

## 2015-11-03 DIAGNOSIS — F39 Unspecified mood [affective] disorder: Secondary | ICD-10-CM | POA: Diagnosis not present

## 2015-11-03 MED ORDER — ACETAMINOPHEN 500 MG PO TABS: 500.0000 mg | ORAL_TABLET | Freq: Every day | ORAL | 1 refills | Status: AC | PRN

## 2015-11-03 MED ORDER — HALOPERIDOL 5 MG PO TABS: 5.0000 mg | ORAL_TABLET | Freq: Three times a day (TID) | ORAL | 1 refills | Status: AC

## 2015-11-03 MED ORDER — PANTOPRAZOLE SODIUM 40 MG PO TBEC: 40.0000 mg | DELAYED_RELEASE_TABLET | Freq: Every day | ORAL | 1 refills | Status: AC

## 2015-11-03 MED ORDER — AQUAPHOR EX OINT: TOPICAL_OINTMENT | Freq: Two times a day (BID) | CUTANEOUS | 1 refills | Status: AC | PRN

## 2015-11-03 MED ORDER — DIVALPROEX SODIUM 500 MG PO DR TAB: 1000.0000 mg | DELAYED_RELEASE_TABLET | Freq: Two times a day (BID) | ORAL | 1 refills | Status: AC

## 2015-11-03 MED ORDER — ZOLPIDEM TARTRATE 5 MG PO TABS: 5.0000 mg | ORAL_TABLET | Freq: Every evening | ORAL | 1 refills | Status: AC | PRN

## 2015-11-03 MED ORDER — PALIPERIDONE PALMITATE 234 MG/1.5ML IM SUSP: 234.0000 mg | INTRAMUSCULAR | 1 refills | Status: AC

## 2015-11-03 MED ORDER — LORAZEPAM 2 MG PO TABS: 2.0000 mg | ORAL_TABLET | Freq: Three times a day (TID) | ORAL | 1 refills | Status: AC

## 2015-11-03 MED ORDER — CHOLECALCIFEROL 25 MCG (1000 UT) PO TABS: 1000.0000 [IU] | ORAL_TABLET | Freq: Every day | ORAL | 1 refills | Status: AC

## 2015-11-03 NOTE — ED Notes (Signed)
Pt is awake and active on the unit this evening. Pt mood is sad/depressed, but he is pleasant and cooperative with staff. Pt denies SI/HI and AVH at this time, although he was hearing voices earlier this afternoon telling him he would be stuck here until Christmas. Writer discussed tx plan and encouraged pt to focus on anger management in order to ensure placement in the future. Food and drink provided and 15 minute checks are ongoing for safety.

## 2015-11-03 NOTE — ED Provider Notes (Signed)
-----------------------------------------   8:06 AM on 11/03/2015 -----------------------------------------   Blood pressure 120/62, pulse (!) 106, temperature 98.3 F (36.8 C), temperature source Oral, resp. rate 18, height 5\' 10"  (1.778 m), weight 256 lb (116.1 kg), SpO2 97 %.  The patient had no acute events since last update.  Calm and cooperative at this time.  Disposition is pending per Psychiatry/Behavioral Medicine team recommendations.     Irean HongJade J Sung, MD 11/03/15 [phone]

## 2015-11-03 NOTE — Progress Notes (Deleted)
CSW is no longer pursuing placement for pt because pt's insurance will not cover Memory Care. Pt's mother has been notified and states that she is unable to pay privately for pt care. CSW suggested home health as an alternative, pt's mother declined. At this point DSS needs to be involved to take over case as pt is unable to care for himself and has no family that is willing to assist in his care.  CSW called DSS after hours number [phone] and was called back by on call social worker. CSW made APS report about pt's inability to care for himself and family's inability to provide care. Weekend CSW is aware and will follow up tomorrow.  [name] [name], MSW, LCSWA [phone] 

## 2015-11-03 NOTE — ED Notes (Signed)
PPD IS NEGATIVE  

## 2015-11-03 NOTE — Progress Notes (Addendum)
Per [facility] psychiatrist, placement has been found for pt. [facility] is willing to accept pt pending in person assessment. If all goes well, facility will pick pt up tomorrow afternoon. Dedra SkeensLauren Lee [facility] will contact CSW with details. Awaiting call.  Jonathon JordanLynn B Hazel Leveille, MSW, Theresia MajorsLCSWA [phone]

## 2015-11-03 NOTE — ED Notes (Signed)
Labs drawn by ed staff

## 2015-11-03 NOTE — Consult Note (Signed)
  Psychiatry: Follow-up for this 30 year old man who continues to be in the emergency room awaiting placement. Patient has no new complaints. He hasn't had no significant behavior problems over the last day or in fact over the entire time he's been in the emergency room for this visit. This morning I received a phone call from Dr. Higinio PlanAnderson-Brown from [facility] to tell me that the team there has put together a plan with the agreement of the guardian that should allow for placement by tomorrow midday. I informed the patient of this and he is quite agreeable to the plan.  No change to medication. I will make sure we have prescriptions in place and inform the emergency room doctor of the tentative plan. Social work and TTS here are advised to the plan and will be involved in making sure discharge and transportation follows through.

## 2015-11-04 DIAGNOSIS — F39 Unspecified mood [affective] disorder: Secondary | ICD-10-CM | POA: Diagnosis not present

## 2015-11-04 LAB — HEPATIC FUNCTION PANEL
ALBUMIN: 3.8 g/dL (ref 3.5–5.0)
ALK PHOS: 64 U/L (ref 38–126)
ALT: 108 U/L — AB (ref 17–63)
AST: 58 U/L — ABNORMAL HIGH (ref 15–41)
BILIRUBIN TOTAL: 0.7 mg/dL (ref 0.3–1.2)
Bilirubin, Direct: <0.1 mg/dL — ABNORMAL LOW (ref 0.1–0.5)
Total Protein: 7.2 g/dL (ref 6.5–8.1)

## 2015-11-04 LAB — VALPROIC ACID LEVEL: Valproic Acid Lvl: 115 ug/mL — ABNORMAL HIGH (ref 50.0–100.0)

## 2015-11-04 MED ORDER — ACETAMINOPHEN 500 MG PO TABS
ORAL_TABLET | ORAL | Status: AC
  Filled 2015-11-04: qty 1

## 2015-11-04 MED ORDER — DIVALPROEX SODIUM 500 MG PO DR TAB
DELAYED_RELEASE_TABLET | ORAL | Status: AC
  Filled 2015-11-04: qty 1

## 2015-11-04 MED ORDER — HALOPERIDOL 5 MG PO TABS
ORAL_TABLET | ORAL | Status: AC
  Filled 2015-11-04: qty 1

## 2015-11-04 MED ORDER — ZOLPIDEM TARTRATE 5 MG PO TABS
ORAL_TABLET | ORAL | Status: AC
  Filled 2015-11-04: qty 1

## 2015-11-04 MED ORDER — LORAZEPAM 2 MG PO TABS
ORAL_TABLET | ORAL | Status: AC
  Filled 2015-11-04: qty 1

## 2015-11-04 NOTE — ED Notes (Signed)
Per social work, [facility]. Upon social work request, Education administratornursing staff provided Child psychotherapistsocial worker with list of medications prescribed for patient upon discharge to help facilitate disposition.

## 2015-11-04 NOTE — ED Notes (Signed)
Patient asleep in room. No noted distress or abnormal behavior. Will continue 15 minute checks and observation by security cameras for safety. 

## 2015-11-04 NOTE — ED Notes (Signed)
Patient currently on phone speaking with mother. No signs of acute distress noted. Maintained on 15 minute checks and observation by security camera for safety.

## 2015-11-04 NOTE — ED Notes (Signed)
Patient resting quietly in room. No noted distress or abnormal behaviors noted. Will continue 15 minute checks and observation by security camera for safety. 

## 2015-11-04 NOTE — ED Notes (Signed)
Patient currently denies SI/HI/AVH and pain. Patient asked if he would be leaving today, and nursing staff stated that disposition would be looked into.  Patient maintains a depressed affect.Patient is calm and cooperative. Maintained on 15 minute checks and observation by security camera for safety.

## 2015-11-04 NOTE — Clinical Social Work Note (Signed)
CSW has spoken to patient's [facility] Coord[facility]: Shelby DubinChervonne Thompson: [phone] and she has stated that [facility]. CSW inquired if she was positive that this was going to take place because the last family care home that she had that was definitely going to take patient did not. Ms. Janee Mornhompson stated that it was a definite decision and she has requested a conference call with her, the group home, and myself at 2:30 to make discharge arrangements. CSW has obtained the discharge medications for patient and the scripts as well with the assistance of patient's nurse today. Will follow up once conference call has been completed. York SpanielMonica Izzac Rockett MSW,LCSW [phone]

## 2015-11-04 NOTE — ED Notes (Signed)
Patient came to nursing station stating that he wanted to talk. Patient said that he got mad while speaking with his mother on the phone and that he "called her a name". At this point he began to cry and stated that he was scared to go to a level 4 facility and that his mother stated that his admission to the hospital was his fault. Nursing staff then spoke with patient about how to cope with anger and ways that he can try to control his anger in the future. Patient began to laugh during this discussion and responded appropriately to questions and scenarios. Patient calm at this time. Will continue to monitor. Maintained on 15 minute checks and observation by security camera for safety.

## 2015-11-04 NOTE — ED Notes (Signed)
Patient resting in recliners in dayroom watching television.  No signs of distress noted at this time. Maintained on 15 minute checks and observation by security camera for safety.

## 2015-11-04 NOTE — ED Notes (Signed)
Per [employer] social work, patient should be visited by potential group home today for an assessment. Patient tentatively may be discharged today to group home pending assessment.

## 2015-11-04 NOTE — ED Notes (Signed)
Patient is currently in dayroom. Patient took all scheduled medication. No signs of distress noted. Maintained on 15 minute checks and observation by security camera for safety.

## 2015-11-04 NOTE — ED Notes (Addendum)
After taking vital signs and assessing a heart rate of 120, nurse asked patient if he experienced headache, dizziness, or chest pain. Patient denied these symptoms. Patient states that he has felt his chest beating heavily off and on during the day for about a year and a half, and that sometimes he "thinks about certain things" and gets anxious. At this time, patient states that he feels calm. Physician notified of abnormal vitals.

## 2015-11-04 NOTE — ED Notes (Signed)
ENVIRONMENTAL ASSESSMENT Potentially harmful objects out of patient reach: Yes Personal belongings secured: Yes Patient dressed in hospital provided attire only: Yes Plastic bags out of patient reach: Yes Patient care equipment (cords, cables, call bells, lines, and drains) shortened, removed, or accounted for: Yes Equipment and supplies removed from bottom of stretcher: Yes Potentially toxic materials out of patient reach: Yes Sharps container removed or out of patient reach: Yes  Patient is currently in room sleeping. No signs of distress noted at this time. Maintained on 15 minute checks and observation by security camera for safety.

## 2015-11-04 NOTE — ED Notes (Signed)
Per social worker Shawna OrleansMelanie, patient will not be accepted by group home today. However, he should be able to go to the group home by Monday. Will await any further updates from social work.

## 2015-11-04 NOTE — ED Notes (Signed)
Patient sitting calmly in dayroom watching television.  No signs of distress noted at this time.Patient denies SI/HI at this time.Maintained on 15 minute checks and observation by security camera for safety.

## 2015-11-04 NOTE — ED Provider Notes (Signed)
-----------------------------------------   6:32 AM on 11/04/2015 -----------------------------------------   Blood pressure 111/67, pulse (!) 102, temperature 97.8 F (36.6 C), temperature source Oral, resp. rate 16, height 5\' 10"  (1.778 m), weight 256 lb (116.1 kg), SpO2 99 %.  The patient had no acute events since last update.  Calm and cooperative at this time.  Disposition is pending per Psychiatry/Behavioral Medicine team recommendations.     Nita Sicklearolina Naylea Wigington, MD 11/04/15 [phone]

## 2015-11-04 NOTE — ED Notes (Signed)
Pt is alert and oriented this evening. Pt mood is appropriate and his affect is flat. Pt denies SI/HI and AVH at this time and is looking forward to his new group home. Pt is active in the milieu and watching TV in room. Food and drink provided and 15 minute checks are ongoing for safety.

## 2015-11-04 NOTE — ED Notes (Signed)
Patient currently in room resting. No signs of distress noted. Maintained on 15 minute checks and observation by security camera for safety.  

## 2015-11-04 NOTE — Clinical Social Work Note (Signed)
CSW received [facility] to tell me that the facility that patient will be going to is out of network and thus requires a lot of paperwork to be completed. Chervonne stated that the discharge will not be able to take place until Monday or Tuesday at this time due to the paperwork requirements. CSW has encouraged Chervonne to have Monday as the latest goal for discharge. Patient's nurse updated. York SpanielMonica Jeromy Borcherding MSW,LCSW [phone]

## 2015-11-04 NOTE — ED Notes (Signed)
Patient came to nurse's station asking if the group home he would go to next was a level 4 locked facility. Staff attempted to reach social worker and find group home online to retrieve answer to no avail. Staff informed patient that that information was unavailable at this time. Patient thanked staff and remained calm and cooperative. Will continue to monitor. Maintained on 15 minute checks and observation by security camera for safety.

## 2015-11-04 NOTE — Clinical Social Work Note (Signed)
Now CSW is being informed that the conference call will not take place until 4:30 today.  York SpanielMonica Mats Jeanlouis MSW,LCSW [phone]

## 2015-11-04 NOTE — ED Notes (Signed)
Patient continues to sit in dayroom watching television. No signs of acute distress noted at this time. Maintained on 15 minute checks and observation by security camera for safety.

## 2015-11-04 NOTE — ED Notes (Signed)
Patient currently in room watching television. No signs of acute distress noted at this time. Maintained on 15 minute checks and observation by security camera for safety.

## 2015-11-04 NOTE — Consult Note (Signed)
  Psychiatry: Follow-up for this 30 year old man with schizophrenia and developmental disability. Plan had been made for him to be discharged today to a new living facility. For reasons that are unclear to me this appears to of fallen through. I am told that we are now promise that he will be discharged on Monday. Patient has been informed of this and is taking it well. No change to clinical situation.

## 2015-11-04 NOTE — ED Notes (Signed)
Snack tray provided without utensils at this time.

## 2015-11-05 DIAGNOSIS — F39 Unspecified mood [affective] disorder: Secondary | ICD-10-CM | POA: Diagnosis not present

## 2015-11-05 MED ORDER — VITAMIN D 1000 UNITS PO TABS
ORAL_TABLET | ORAL | Status: AC
  Administered 2015-11-05: 1000 [IU] via ORAL
  Filled 2015-11-05: qty 1

## 2015-11-05 MED ORDER — LORAZEPAM 2 MG PO TABS
ORAL_TABLET | ORAL | Status: AC
  Administered 2015-11-05: 2 mg via ORAL
  Filled 2015-11-05: qty 1

## 2015-11-05 MED ORDER — DIVALPROEX SODIUM 500 MG PO DR TAB
DELAYED_RELEASE_TABLET | ORAL | Status: AC
  Filled 2015-11-05: qty 2

## 2015-11-05 MED ORDER — PANTOPRAZOLE SODIUM 40 MG PO TBEC
DELAYED_RELEASE_TABLET | ORAL | Status: AC
  Administered 2015-11-05: 40 mg via ORAL
  Filled 2015-11-05: qty 1

## 2015-11-05 MED ORDER — HALOPERIDOL 5 MG PO TABS
ORAL_TABLET | ORAL | Status: AC
  Administered 2015-11-05: 5 mg via ORAL
  Filled 2015-11-05: qty 1

## 2015-11-05 MED ORDER — DIVALPROEX SODIUM ER 500 MG PO TB24
ORAL_TABLET | ORAL | Status: AC
  Filled 2015-11-05: qty 2

## 2015-11-05 MED ORDER — HALOPERIDOL 5 MG PO TABS
ORAL_TABLET | ORAL | Status: AC
Start: 2015-11-05 — End: 2015-11-06
  Filled 2015-11-05: qty 1

## 2015-11-05 MED ORDER — ACETAMINOPHEN 500 MG PO TABS
ORAL_TABLET | ORAL | Status: AC
  Filled 2015-11-05: qty 1

## 2015-11-05 MED ORDER — HALOPERIDOL 5 MG PO TABS
ORAL_TABLET | ORAL | Status: AC
Start: 2015-11-05 — End: 2015-11-05
  Administered 2015-11-05: 5 mg via ORAL
  Filled 2015-11-05: qty 1

## 2015-11-05 MED ORDER — LORAZEPAM 2 MG PO TABS
ORAL_TABLET | ORAL | Status: AC
  Filled 2015-11-05: qty 1

## 2015-11-05 MED ORDER — METOPROLOL TARTRATE 25 MG PO TABS
ORAL_TABLET | ORAL | Status: AC
  Administered 2015-11-05: 12.5 mg via ORAL
  Filled 2015-11-05: qty 1

## 2015-11-05 MED ORDER — METOPROLOL TARTRATE 25 MG PO TABS
12.5000 mg | ORAL_TABLET | Freq: Two times a day (BID) | ORAL | Status: DC
  Administered 2015-11-05 (×2): 12.5 mg via ORAL
  Filled 2015-11-05 (×2): qty 1

## 2015-11-05 NOTE — ED Notes (Signed)
Patient asleep in room. No noted distress or abnormal behavior. Will continue 15 minute checks and observation by security cameras for safety. 

## 2015-11-05 NOTE — ED Notes (Signed)
Report given to oncoming shift at this time.

## 2015-11-05 NOTE — ED Notes (Signed)
Patient spoke with nurse stating that he felt like his heart was going to jump out of his chest. He denied feeling chest pain or dizziness. He said that sometimes when he has palpitations at night , he "gets worried that I won't wake up in the morning". Per physician order, will administer metoprolol 12.5 twice daily. Will continue to monitor heart rate and blood pressure.

## 2015-11-05 NOTE — ED Notes (Signed)
Patient currently in dayroom watching television. No signs of distress noted at this time. Maintained on 15 minute checks and observation by security camera for safety.  

## 2015-11-05 NOTE — ED Notes (Signed)
Nursing staff asked social work if [facility] a Level IV locked facility. Social work stated that they were unsure of this at this time but stated that [facility] could likely answer this question on Monday morning.

## 2015-11-05 NOTE — ED Notes (Signed)
Patient resting quietly in room. No noted distress or abnormal behaviors noted. Will continue 15 minute checks and observation by security camera for safety. 

## 2015-11-05 NOTE — ED Notes (Signed)

## 2015-11-05 NOTE — ED Notes (Signed)
Patient is currently in room resting. No signs of distress noted at this time. Maintained on 15 minute checks and observation by security camera for safety.

## 2015-11-05 NOTE — ED Provider Notes (Signed)
-----------------------------------------   2:49 PM on 11/05/2015 -----------------------------------------  Patient has persistent sinus tachycardia around 100-110 bpm. Workup has included labs, cardiac enzymes, TSH, all within normal limits. Patient is asymptomatic besides occasionally stating he can feel his heart racing. Patient's blood pressure is in the 120s/130s. We will start the patient on a low-dose metoprolol 12.5 mg twice a day monitor in the emergency department.   Minna AntisKevin Allea Kassner, MD 11/05/15 1450

## 2015-11-05 NOTE — ED Provider Notes (Signed)
-----------------------------------------   5:31 AM on 11/05/2015 -----------------------------------------   Blood pressure 133/81, pulse (!) 109, temperature 98 F (36.7 C), temperature source Oral, resp. rate 18, height 5\' 10"  (1.778 m), weight 256 lb (116.1 kg), SpO2 100 %.  The patient had no acute events since last update.  Calm and cooperative at this time.  Disposition is pending per Psychiatry/Behavioral Medicine team recommendations.     Jeanmarie PlantJames A McShane, MD 11/05/15 [phone]

## 2015-11-05 NOTE — ED Notes (Signed)
Patient remains calm and cooperative this shift with no issues to report.Q15 minute checks and observation by security camera continues.

## 2015-11-05 NOTE — ED Notes (Addendum)
Patient came to nurse stating, "I feel better". Patient says that he no longer has palpitations and that he feels less warm than before. Will continue to monitor for additional symptoms.

## 2015-11-05 NOTE — ED Notes (Signed)
Patient currently denies SI/HI/AVH. Patient states that he has pain 10/110 in his right foot but refused tylenol stating, "It will get better shortly". Patient denies feelings of depression and anxiety. He states that he is aware of the plan of possible discharge on Monday and that he is content waiting patiently for that time. Patient remains calm and cooperative. No signs of acute distress. Maintained on 15 minute checks and observation by security camera for safety.

## 2015-11-05 NOTE — ED Provider Notes (Signed)
EKG: Interpreted by me, normal sinus rhythm with a rate 100 bpm, normal axis, normal intervals, no evidence of hypertrophy or acute infarction. Normal EKG   Emily FilbertJonathan E Williams, MD 11/05/15 [phone]

## 2015-11-05 NOTE — ED Notes (Signed)
Report was received from Reather ConverseKarena T., RN; Pt. Verbalizes  Distress about not knowing about this new group home that; he is going to on Monday 11/07/2015; denies S.I./Hi. Continue to monitor with 15 min. Monitoring.

## 2015-11-05 NOTE — ED Notes (Signed)
ENVIRONMENTAL ASSESSMENT Potentially harmful objects out of patient reach: Yes Personal belongings secured: Yes Patient dressed in hospital provided attire only: Yes Plastic bags out of patient reach: Yes Patient care equipment (cords, cables, call bells, lines, and drains) shortened, removed, or accounted for: Yes Equipment and supplies removed from bottom of stretcher: Yes Potentially toxic materials out of patient reach: Yes Sharps container removed or out of patient reach: Yes  Patient currently in room sleeping. No signs of distress noted at this time. Maintained on 15 minute checks and observation by security camera for safety.  

## 2015-11-05 NOTE — ED Notes (Addendum)
Patient stated that he "was feeling funny". Patient says that he feels heaviness in the left side of his chest and that he feels abnormally warm and clammy. He also reports that both of his hands have pins and needles feelings. He recalls that these feelings started about 10 to 15 minutes after taking metoprolol 12.5mg . He says "I want to cry because I don't feel good". Upon assessment, vital signs stable, patient hands feel clammy to touch. Alerted physician, awaiting further orders at this time.

## 2015-11-05 NOTE — ED Notes (Signed)
Patient currently in shower. Patient took all scheduled medication without issue. No signs of acute distress noted at this time. Maintained on 15 minute checks and observation by security camera for safety.

## 2015-11-05 NOTE — ED Notes (Signed)
PT VOLUNTARY STILL PENDING PLACEMENT/MAYBE PLACED WITH [facility] PENDING A FACE TO FACE CONSULTATION.

## 2015-11-06 DIAGNOSIS — F39 Unspecified mood [affective] disorder: Secondary | ICD-10-CM | POA: Diagnosis not present

## 2015-11-06 MED ORDER — PANTOPRAZOLE SODIUM 40 MG PO TBEC
DELAYED_RELEASE_TABLET | ORAL | Status: AC
  Filled 2015-11-06: qty 1

## 2015-11-06 MED ORDER — HALOPERIDOL 5 MG PO TABS
5.0000 mg | ORAL_TABLET | Freq: Three times a day (TID) | ORAL | Status: DC
  Administered 2015-11-06 – 2015-11-18 (×35): 5 mg via ORAL
  Filled 2015-11-06 (×35): qty 1

## 2015-11-06 MED ORDER — VITAMIN D 1000 UNITS PO TABS
1000.0000 [IU] | ORAL_TABLET | Freq: Every day | ORAL | Status: DC
  Administered 2015-11-07 – 2015-11-18 (×12): 1000 [IU] via ORAL
  Filled 2015-11-06 (×12): qty 1

## 2015-11-06 MED ORDER — DIVALPROEX SODIUM 500 MG PO DR TAB
DELAYED_RELEASE_TABLET | ORAL | Status: AC
Start: 2015-11-06 — End: 2015-11-06
  Administered 2015-11-06: 1000 mg
  Filled 2015-11-06: qty 2

## 2015-11-06 MED ORDER — PANTOPRAZOLE SODIUM 40 MG PO TBEC
40.0000 mg | DELAYED_RELEASE_TABLET | Freq: Every day | ORAL | Status: DC
  Administered 2015-11-07 – 2015-11-18 (×12): 40 mg via ORAL
  Filled 2015-11-06 (×12): qty 1

## 2015-11-06 MED ORDER — ACETAMINOPHEN 325 MG PO TABS
650.0000 mg | ORAL_TABLET | Freq: Four times a day (QID) | ORAL | Status: DC | PRN
  Administered 2015-11-09 – 2015-11-16 (×7): 650 mg via ORAL
  Filled 2015-11-06 (×7): qty 2

## 2015-11-06 MED ORDER — HALOPERIDOL 5 MG PO TABS: 5.0000 mg | ORAL_TABLET | Freq: Two times a day (BID) | ORAL | Status: DC

## 2015-11-06 MED ORDER — DIVALPROEX SODIUM 500 MG PO DR TAB
1000.0000 mg | DELAYED_RELEASE_TABLET | Freq: Two times a day (BID) | ORAL | Status: DC
  Administered 2015-11-06 – 2015-11-18 (×24): 1000 mg via ORAL
  Filled 2015-11-06 (×24): qty 2

## 2015-11-06 MED ORDER — LORAZEPAM 2 MG PO TABS
ORAL_TABLET | ORAL | Status: AC
  Administered 2015-11-06: 2 mg
  Filled 2015-11-06: qty 1

## 2015-11-06 MED ORDER — VITAMIN D 1000 UNITS PO TABS
ORAL_TABLET | ORAL | Status: AC
  Filled 2015-11-06: qty 1

## 2015-11-06 MED ORDER — HALOPERIDOL 5 MG PO TABS
ORAL_TABLET | ORAL | Status: AC
Start: 2015-11-06 — End: 2015-11-06
  Filled 2015-11-06: qty 1

## 2015-11-06 MED ORDER — ZOLPIDEM TARTRATE 5 MG PO TABS
5.0000 mg | ORAL_TABLET | Freq: Every evening | ORAL | Status: DC | PRN
  Administered 2015-11-06 – 2015-11-17 (×8): 5 mg via ORAL
  Filled 2015-11-06 (×8): qty 1

## 2015-11-06 MED ORDER — METOPROLOL TARTRATE 25 MG PO TABS
12.5000 mg | ORAL_TABLET | Freq: Two times a day (BID) | ORAL | Status: DC
Start: 2015-11-06 — End: 2015-11-08
  Administered 2015-11-06 – 2015-11-08 (×4): 12.5 mg via ORAL
  Filled 2015-11-06 (×4): qty 1

## 2015-11-06 MED ORDER — LORAZEPAM 1 MG PO TABS
1.0000 mg | ORAL_TABLET | Freq: Three times a day (TID) | ORAL | Status: DC
  Administered 2015-11-06 – 2015-11-18 (×35): 1 mg via ORAL
  Filled 2015-11-06 (×37): qty 1

## 2015-11-06 NOTE — ED Notes (Signed)
All meds are to be renewed

## 2015-11-06 NOTE — ED Notes (Signed)
PT REMAINS VOL. AND PENDING PLACEMENT W/ [facility] ON MON 8-14.

## 2015-11-06 NOTE — ED Provider Notes (Signed)
Vitals:   11/05/15 1935 11/06/15 0612  BP: 117/68 (!) 97/51  Pulse: 99 87  Resp: 18 18  Temp: 97.9 F (36.6 C) 98.1 F (36.7 C)   No reported acute events overnight.  HR this morning reported 87.  Patient now on low dose metoprolol.  Awaiting dispo planning per psychiatry.   Governor Rooksebecca Reily Ilic, MD 11/06/15 [phone]

## 2015-11-06 NOTE — ED Notes (Signed)
Pt is aware he is still waitng for group home placement he has no questions at this time

## 2015-11-06 NOTE — ED Notes (Signed)
Pt asking question of what level is his group home ,  I will ask social work, support offered to him as he waits for placement

## 2015-11-06 NOTE — ED Notes (Signed)
Pr awake breakfast given

## 2015-11-06 NOTE — ED Notes (Signed)
Pt has had approriate behavior all day he is polite and shows absolutely no signs of agression

## 2015-11-06 NOTE — ED Notes (Signed)
Spoke with Dr Shaune PollackLord about holding lopressor due to low bp she agrees to hold it

## 2015-11-06 NOTE — ED Notes (Signed)

## 2015-11-06 NOTE — ED Notes (Signed)
Report was received from Ruth B., RN; Pt. Verbalizes no complaints or distress; denies S.I./Hi. Continue to monitor with 15 min. Monitoring. 

## 2015-11-07 DIAGNOSIS — F39 Unspecified mood [affective] disorder: Secondary | ICD-10-CM | POA: Diagnosis not present

## 2015-11-07 NOTE — ED Notes (Signed)

## 2015-11-07 NOTE — ED Notes (Signed)
Pt. Alert and oriented, warm and dry, in no distress. Pt. Denies SI, HI, and AVH. Pt complained of some left leg pain 7 out of 10. PRN 500 mg tylenol given. Pt. Encouraged to let nursing staff know of any concerns or needs.

## 2015-11-07 NOTE — ED Notes (Signed)
ED BHU PLACEMENT JUSTIFICATION Is the patient under IVC or is there intent for IVC: No. Is the patient medically cleared: Yes.   Is there vacancy in the ED BHU: Yes.   Is the population mix appropriate for patient: Yes.   Is the patient awaiting placement in inpatient or outpatient setting: Yes.   Has the patient had a psychiatric consult: Yes.   Survey of unit performed for contraband, proper placement and condition of furniture, tampering with fixtures in bathroom, shower, and each patient room: Yes.  ; Findings: NA APPEARANCE/BEHAVIOR calm, cooperative and adequate rapport can be established NEURO ASSESSMENT Orientation: time, place and person Hallucinations: No.None noted (Hallucinations) Speech: Normal Gait: normal RESPIRATORY ASSESSMENT Normal expansion.  Clear to auscultation.  No rales, rhonchi, or wheezing. CARDIOVASCULAR ASSESSMENT regular rate and rhythm, S1, S2 normal, no murmur, click, rub or gallop GASTROINTESTINAL ASSESSMENT soft, nontender, BS WNL, no r/g EXTREMITIES normal strength, tone, and muscle mass PLAN OF CARE Provide calm/safe environment. Vital signs assessed twice daily. ED BHU Assessment once each 12-hour shift. Collaborate with intake RN daily or as condition indicates. Assure the ED provider has rounded once each shift. Provide and encourage hygiene. Provide redirection as needed. Assess for escalating behavior; address immediately and inform ED provider.  Assess family dynamic and appropriateness for visitation as needed: Yes.  ; If necessary, describe findings: NA Educate the patient/family about BHU procedures/visitation: Yes.  ; If necessary, describe findings: NA  

## 2015-11-07 NOTE — ED Provider Notes (Signed)
-----------------------------------------   7:52 AM on 11/07/2015 -----------------------------------------   Blood pressure 122/80, pulse 83, temperature 98 F (36.7 C), temperature source Oral, resp. rate 18, height 5\' 10"  (1.778 m), weight 256 lb (116.1 kg), SpO2 97 %.  The patient had no acute events since last update.  Calm and cooperative at this time.    The patient is awaiting placement in a new facility.     Rebecka ApleyAllison P Webster, MD 11/07/15 [phone]

## 2015-11-07 NOTE — Consult Note (Signed)
  Psychiatry: Patient still in the emergency room. I spoke to [facility] again today. Apparently the hold up on discharge has been because of some paperwork not being completed by the proposed new residential facility. They are working to try and get this resolved and hope for a discharge later this week. No change to treatment plan.

## 2015-11-07 NOTE — Progress Notes (Signed)
Per [facility] pt will not be ready for d/c today. [facility] is waiting to receive one more [facility] of [facility] before [facility] can be approved. However, [facility] states that everything is prepared and ready to go on their side. Projection for d/c is Tuesday or Wednesday. CSW will continue to follow pt and assist as needed.  Jonathon JordanLynn B Mikale Silversmith, MSW, Theresia MajorsLCSWA [phone]

## 2015-11-08 DIAGNOSIS — F39 Unspecified mood [affective] disorder: Secondary | ICD-10-CM | POA: Diagnosis not present

## 2015-11-08 MED ORDER — METOPROLOL TARTRATE 25 MG PO TABS
12.5000 mg | ORAL_TABLET | Freq: Every day | ORAL | Status: DC
  Administered 2015-11-09 – 2015-11-18 (×10): 12.5 mg via ORAL
  Filled 2015-11-08 (×10): qty 1

## 2015-11-08 NOTE — ED Notes (Signed)
Patient currently in room speaking on the phone. No signs of distress noted. Maintained on 15 minute checks and observation by security camera for safety.

## 2015-11-08 NOTE — ED Notes (Signed)
Patient came to nurse stating that he has blurred vision that started from two days ago. He says he has trouble reading and that there are "fuzzy halos" around the edges of people's bodies. Physician made aware, orders pending.

## 2015-11-08 NOTE — ED Notes (Signed)
Patient resting quietly in room. No noted distress or abnormal behaviors noted. Will continue 15 minute checks and observation by security camera for safety. 

## 2015-11-08 NOTE — ED Notes (Signed)
ED BHU PLACEMENT JUSTIFICATION Is the patient under IVC or is there intent for IVC: No. Is the patient medically cleared: Yes.   Is there vacancy in the ED BHU: Yes.   Is the population mix appropriate for patient: Yes.   Is the patient awaiting placement in inpatient or outpatient setting: Yes.   Has the patient had a psychiatric consult: Yes.   Survey of unit performed for contraband, proper placement and condition of furniture, tampering with fixtures in bathroom, shower, and each patient room: Yes.  ; Findings: NA APPEARANCE/BEHAVIOR calm, cooperative and adequate rapport can be established NEURO ASSESSMENT Orientation: time, place and person Hallucinations: No.None noted (Hallucinations) Speech: Normal Gait: normal RESPIRATORY ASSESSMENT Normal expansion.  Clear to auscultation.  No rales, rhonchi, or wheezing. CARDIOVASCULAR ASSESSMENT regular rate and rhythm, S1, S2 normal, no murmur, click, rub or gallop GASTROINTESTINAL ASSESSMENT soft, nontender, BS WNL, no r/g EXTREMITIES normal strength, tone, and muscle mass PLAN OF CARE Provide calm/safe environment. Vital signs assessed twice daily. ED BHU Assessment once each 12-hour shift. Collaborate with intake RN daily or as condition indicates. Assure the ED provider has rounded once each shift. Provide and encourage hygiene. Provide redirection as needed. Assess for escalating behavior; address immediately and inform ED provider.  Assess family dynamic and appropriateness for visitation as needed: Yes.  ; If necessary, describe findings: NA Educate the patient/family about BHU procedures/visitation: Yes.  ; If necessary, describe findings: NA  

## 2015-11-08 NOTE — ED Notes (Signed)
Sandwich and soft drink given.  

## 2015-11-08 NOTE — ED Notes (Signed)
Patient in dayroom watching television. No signs of distress noted. Maintained on 15 minute checks and observation by security camera for safety.  

## 2015-11-08 NOTE — ED Notes (Signed)
Patient is currently in dayroom resting. No signs of distress noted. Maintained on 15 minute checks and observation by security camera for safety.

## 2015-11-08 NOTE — ED Notes (Signed)
Pt. Alert and oriented, warm and dry, in no distress. Pt. Denies SI, HI, and AVH. Pt complained of left foot pain 5/10 and requested tylenol. PRN dose 500 mg tylenol given po.  Pt reports blurred vision is a little better. Pt. Encouraged to let nursing staff know of any concerns or needs.

## 2015-11-08 NOTE — ED Notes (Signed)
Patient came to nurse's station asking to talk. He states that he currently feels anxious about when he would leave. Nurse offered to allow patient to speak with social work and patient agreed that this would be helpful. Social worker notified.

## 2015-11-08 NOTE — Consult Note (Signed)
  Psychiatry: Brief follow-up. Patient has had no new behavior problems. No new complaints. Medically and behaviorally stable. We continue to await the completion of all the paperwork but will allow for discharge.

## 2015-11-08 NOTE — Progress Notes (Signed)
CSW spoke with [facility] director, Ms. McCullum [phone] for update on referral. Ms. Priscille KluverMcCullum states that she has turned in all necessary paperwork to [facility] and is waiting for them to draft a contract for her. Pending coordination with [facility], [facility] should be able to accept pt no later than Friday.  CSW then called spoke with [facility] [phone]. Chervon stated that all necessary paperwork was complete. CSW will be on a confrence call tomorrow at 3pm with Chervon, pt's legal guardian, and Group Home director to coordinate a date for pt d/c.  CSW notified pt about the updates above. Pt expressed some anxiety about being stuck in the hospital for an extended period of time. CSW expressed her understanding and provided emotional support.   CSW will continue to follow pt and assist as needed.  Jonathon JordanLynn B Jolanda Mccann, MSW, Theresia MajorsLCSWA [phone]

## 2015-11-08 NOTE — ED Notes (Signed)

## 2015-11-08 NOTE — ED Notes (Signed)
Patient currently pacing in dayroom. When asked if he was okay, patient states that he is but that he is thinking about going home. Will continue to monitor. Maintained on 15 minute checks and observation by security camera for safety.

## 2015-11-08 NOTE — ED Notes (Signed)

## 2015-11-08 NOTE — ED Provider Notes (Signed)
-----------------------------------------   6:47 AM on 11/08/2015 -----------------------------------------   BP 120/76 (BP Location: Left Arm)   Pulse 83   Temp 98.2 F (36.8 C) (Oral)   Resp 18   Ht 5\' 10"  (1.778 m)   Wt 256 lb (116.1 kg)   SpO2 98%   BMI 36.73 kg/m   No acute events since last update. Disposition is pending per Psychiatry/Behavioral Medicine team recommendations.     Phineas SemenGraydon Laquonda Welby, MD 11/08/15 [phone]

## 2015-11-08 NOTE — ED Notes (Signed)
Patient denies SI/HI/AVH and pain. Patient states that he does not feel as anxious as he did yesterday but wanted to watch television later in the day to help with his anxiety. Patient remains calm and cooperative. No signs of distress noted. Maintained on 15 minute checks and observation by security camera for safety.

## 2015-11-08 NOTE — ED Notes (Signed)
Patient currently on phone speaking with mother. No signs of distress noted at this time. Maintained on 15 minute checks and observation by security camera for safety.

## 2015-11-08 NOTE — ED Notes (Signed)
Patient asleep in room. No noted distress or abnormal behavior. Will continue 15 minute checks and observation by security cameras for safety. 

## 2015-11-09 ENCOUNTER — Emergency Department: Payer: Medicare Other

## 2015-11-09 DIAGNOSIS — F39 Unspecified mood [affective] disorder: Secondary | ICD-10-CM | POA: Diagnosis not present

## 2015-11-09 MED ORDER — LORAZEPAM 1 MG PO TABS
1.0000 mg | ORAL_TABLET | Freq: Once | ORAL | Status: AC
  Administered 2015-11-09: 1 mg via ORAL

## 2015-11-09 NOTE — ED Notes (Signed)
Patient states at this time that he feels less anxious and that he is able to breathe easier. Maintained on 15 minute checks and observation by security camera for safety.

## 2015-11-09 NOTE — ED Notes (Signed)
Patient resting quietly in dayroom and socializing  No noted distress or abnormal behaviors noted. Will continue 15 minute checks and observation by security camera for safety.

## 2015-11-09 NOTE — ED Notes (Signed)
Patient came to nurse complaining of shortness of breath. Breath sounds clear to auscultation, respiratory rate 18 and pulse ox 98% on room air. ED physician ordered 1 mg of ativan for patient to ease anxiety. Nurse repositioned patient in bed at 60 degree angle. Will continue to monitor for additional signs of distress. Maintained on 15 minute checks and observation by security camera for safety.

## 2015-11-09 NOTE — ED Notes (Signed)
Patient currently says that he feels like he is "trying to breath under water". He says that he initially was able to breathe better after the ativan, but that he started having shortness of breath a few minutes afterwards. Physician notified, awaiting further orders.

## 2015-11-09 NOTE — ED Notes (Signed)
Patient resting in dayroom at this time. No signs of acute distress. Maintained on 15 minute checks and observation by security camera for safety.

## 2015-11-09 NOTE — ED Notes (Signed)
Patient in dayroom resting. No signs of distress noted. Maintained on 15 minute checks and observation by security camera for safety.  

## 2015-11-09 NOTE — Progress Notes (Addendum)
CSW participated in confrence call with Diona FantiLaren Lee [employer] Supervisor, [name] legal guardian, [facility] Coordinator, Ms. McCullum [facility] Director, and Dr. Berneda RosePrice [employer] psychiatrist. Per Mariah MillingLaren Lee and Ms. McCullum, the absolute d/c date for patient is Friday August 18th. Pt will need transportation to [facility] [place], [place] which CSW was requested to arrange. CSW stated that she would consult with supervisor and report back. Ms. Priscille KluverMcCullum also states she needs an updated FL2 on her form that she will provide. CSW is still awaiting fax from Ms. McCullum with the necessary form. Group Home also needs pt's current meds and 30 day scripts. RN notified.  CSW consulted with supervisor and was informed that transportation will need to be arranged by pt's legal guardian or [facility]. [facility] is not able to [facility]. CSW called Herma MeringChervon [phone] and notified her of update. CSW also called Waldron SessionVonda [phone] to notify her as well. No answer, left a message.  Jonathon JordanLynn B Alyx [name], MSW, Theresia MajorsLCSWA [phone]

## 2015-11-09 NOTE — ED Notes (Signed)
Patient currently in dayroom resting. No signs of acute distress noted. Maintained on 15 minute checks and observation by security camera for safety.

## 2015-11-09 NOTE — ED Notes (Signed)
Patient in dayroom watching television. No signs of distress noted. Maintained on 15 minute checks and observation by security camera for safety.

## 2015-11-09 NOTE — ED Notes (Signed)
Patient currently in room eating dinner. No signs of distress noted at this time. Maintained on 15 minute checks and observation by security camera for safety.

## 2015-11-09 NOTE — ED Notes (Signed)
VOL/Pending placement 

## 2015-11-09 NOTE — ED Notes (Addendum)
Patient currently denies SI/HI/AVH and pain. Pateint denies feelings of depression but seems anxious as he continue to pace in the dayroom. Patient is calm and cooperative at this time and responsive to nursing interventions.  Maintained on 15 minute checks and observation by security camera for safety.

## 2015-11-09 NOTE — ED Provider Notes (Signed)
-----------------------------------------   7:22 AM on 11/09/2015 -----------------------------------------   Blood pressure 126/81, pulse 100, temperature 98.1 F (36.7 C), temperature source Oral, resp. rate 18, height 5\' 10"  (1.778 m), weight 256 lb (116.1 kg), SpO2 98 %.  The patient had no acute events since last update.  Calm and cooperative at this time.    The patient is awaiting placement through social work.     Rebecka ApleyAllison P Margie Urbanowicz, MD 11/09/15 [phone]

## 2015-11-09 NOTE — ED Notes (Signed)
Patient taken to xray. Patient shows no signs of further distress at this time. Maintained on 15 minute checks and observation by security camera for safety.

## 2015-11-09 NOTE — ED Notes (Signed)
Patient complains of pain in his left foot rated a 6/10. Acetaminophen 650 mg given to manage pain. Will reassess in one hour. Maintained on 15 minute checks and observation by security camera for safety.

## 2015-11-09 NOTE — Consult Note (Signed)
  Psychiatry: Brief follow-up. Patient has no new behavior problems. No complaints. Has not been violent or aggressive. No new medical problems. We continue to wait on completion of paperwork so that he can be discharged back to the community.

## 2015-11-09 NOTE — ED Provider Notes (Signed)
-----------------------------------------   11:00 AM on 11/09/2015 -----------------------------------------  I was called to the BHU to evaluate the patient who is complaining of shortness of breath ongoing since this morning. He is pacing, appears mildly anxious. He reports that this has happened to him "when I bend and small rooms". I suspect this is related to anxiety, some claustrophobia. We'll treat with additional Ativan. CXR ordered by me and is negative. I evaluated the patient, his lungs are clear, no stridor, no wheeze, he has no increased work of breathing, his O2 saturation is normal. No clinical evidence to suggest DVT, no leg swelling, calf asymmetry or tenderness. Continue to monitor and treat symptomatically.   Gayla DossEryka A Trinaty Bundrick, MD 11/14/15 [phone]

## 2015-11-09 NOTE — ED Notes (Signed)
ENVIRONMENTAL ASSESSMENT Potentially harmful objects out of patient reach: Yes Personal belongings secured: Yes Patient dressed in hospital provided attire only: Yes Plastic bags out of patient reach: Yes Patient care equipment (cords, cables, call bells, lines, and drains) shortened, removed, or accounted for: Yes Equipment and supplies removed from bottom of stretcher: Yes Potentially toxic materials out of patient reach: Yes Sharps container removed or out of patient reach: Yes  Patient in room sleeping. No signs of distress noted at this time. Maintained on 15 minute checks and observation by security camera for safety.

## 2015-11-10 DIAGNOSIS — F39 Unspecified mood [affective] disorder: Secondary | ICD-10-CM | POA: Diagnosis not present

## 2015-11-10 MED ORDER — ALUM & MAG HYDROXIDE-SIMETH 200-200-20 MG/5ML PO SUSP
ORAL | Status: AC
  Administered 2015-11-10: 30 mL via ORAL
  Filled 2015-11-10: qty 30

## 2015-11-10 MED ORDER — ALUM & MAG HYDROXIDE-SIMETH 200-200-20 MG/5ML PO SUSP
30.0000 mL | Freq: Four times a day (QID) | ORAL | Status: DC | PRN
  Administered 2015-11-10: 30 mL via ORAL

## 2015-11-10 NOTE — ED Notes (Signed)
Patient is been calm and cooperative, no evidence of distress, Patient is watching tv , Patient denies Si/hi or avh at this time, Patient with q 15 min. Checks and camera monitoring.

## 2015-11-10 NOTE — ED Notes (Signed)
Patient without signs of distress, Patient with q 15 min. Checks and camera monitoring.

## 2015-11-10 NOTE — ED Notes (Signed)
Patient is alert and oriented, in dayroom, no behavior issues, Patient is pleasant, q 15 min. Checks.

## 2015-11-10 NOTE — ED Notes (Signed)
Patient is alert and oriented in no apparent distress. Patient is calmly sitting in dayroom watching television. Patient denies SI, HI. Patient is encouraged to let nursing staff know of any concerns or needs.Will continue 15 minute checks and observation by security camera for safety.

## 2015-11-10 NOTE — ED Notes (Addendum)
Patient is sitting in dayroom, no signs of distress, Patient is oriented, He is excited about possible discharge tomorrow to another group home, Patient is cooperative, nurse will continue to monitor. q 15 min. Checks.

## 2015-11-10 NOTE — ED Notes (Signed)
Patient alert and oriented, Patient talking to aunt on phone, he is cooperative, no behavior issues, q 15 min. Checks, He is safe.

## 2015-11-10 NOTE — ED Notes (Signed)
Patient alert and oriented, no signs of distress, q 15 min. Checks, camera surveillance in place.

## 2015-11-10 NOTE — ED Notes (Signed)
Patient is alert and oriented, up on and off, ate breakfast, Patient without complaints, he is cooperative, q 15 mins checks and camera monitoring in progress.

## 2015-11-10 NOTE — ED Notes (Signed)
Patient took shower.

## 2015-11-10 NOTE — ED Notes (Signed)
Patient's lunch served.

## 2015-11-10 NOTE — ED Notes (Signed)
Patient is talking on phone, no signs of distress.

## 2015-11-10 NOTE — Progress Notes (Signed)
CSW was requested to complete an updated FL2 on a form provided by Ms. McCullum from [facility]. CSW agreed and faxed information to Ms. McCullum at [phone]. Ms. Alva GarnetMcCulum is also requesting a list of pt's current medications and 30 day scripts, both of which have been prepared in the pt's d/c packet. At this point CSW has fulfilled all requested requirements for d/c to [facility] for Friday 8/17.  CSW also contacted Chervon with [employer] at [phone] for an update on the plan for [place]. Chervon is still checking with her supervisors but will call CSW back with more information. CSW awaiting call back.  Jonathon JordanLynn B Sherril Shipman, MSW, Theresia MajorsLCSWA [phone]

## 2015-11-10 NOTE — ED Provider Notes (Signed)
-----------------------------------------   7:11 AM on 11/10/2015 -----------------------------------------   Blood pressure 131/69, pulse 100, temperature 98.5 F (36.9 C), temperature source Oral, resp. rate 20, height 5\' 10"  (1.778 m), weight 256 lb (116.1 kg), SpO2 99 %.  The patient had no acute events since last update.  Calm and cooperative at this time.  Disposition is pending per Psychiatry/Behavioral Medicine team recommendations.     Willy EddyPatrick Emogene Muratalla, MD 11/10/15 [phone]

## 2015-11-10 NOTE — Progress Notes (Signed)
CSW spoke with [name] [name] from [facility] [phone]. [name] [name] is requesting that the medications on the pt's updated FL2 be changed to match what pt is currently being given in the hospital. CSW gave [name] [name] Dr. Shary Keylapac's number and instructed her to direct any medication concerns to him.   [name] [name] also informed CSW that she has not received the contract from [employer] and insurance authorization is still pending. Therefore, [name] [name] is unable to accept pt tomorrow. She projects new d/c date as Monday.   CSW called Chervon with [employer] to follow up on why d/c is being delayed yet again. No answer, left a message. Weekend CSW notified.  Jonathon JordanLynn B Kerly Rigsbee, MSW, Theresia MajorsLCSWA [phone]

## 2015-11-11 DIAGNOSIS — F39 Unspecified mood [affective] disorder: Secondary | ICD-10-CM | POA: Diagnosis not present

## 2015-11-11 MED ORDER — LORAZEPAM 1 MG PO TABS
1.0000 mg | ORAL_TABLET | Freq: Once | ORAL | Status: AC
  Administered 2015-11-11: 1 mg via ORAL

## 2015-11-11 NOTE — ED Notes (Signed)
Patient stated that he was feeling quite anxious because he keeps thinking about his discharge date and whether he will be able to leave soon or not. Nurse provided reassurance and administered 1 mg of ativan per physician order. Maintained on 15 minute checks and observation by security camera for safety.

## 2015-11-11 NOTE — ED Notes (Signed)
Patient currently in dayroom resting. No signs of distress noted. Maintained on 15 minute checks and observation by security camera for safety.  

## 2015-11-11 NOTE — ED Notes (Signed)
Patient in dayroom resting. No signs of distress noted. Maintained on 15 minute checks and observation by security camera for safety.  

## 2015-11-11 NOTE — ED Notes (Signed)
Patient came to nurse and stated that after speaking with his mom he got angry because she continues to say that he is not being placed because he threw a chair. Nurse explained to patient why discharge was held up per social work note and patient voiced understanding. Will continue to monitor. Maintained on 15 minute checks and observation by security camera for safety.

## 2015-11-11 NOTE — ED Notes (Signed)
Patient currently in dayroom socializing with peers. No sign of distress noted at his time. Maintained on 15 minute checks and observation by security camera for safety.

## 2015-11-11 NOTE — ED Notes (Signed)
Patient currently denies SI/HI/AVH and pain. Patient asked if he was going to leave today, and nurse explained that there may be a delay in his transfer to the new group home. Patient voiced understanding and relayed that he would not be upset if he didn't leave today. Patient is calm and cooperative this morning. Maintained on 15 minute checks and observation by security camera for safety.

## 2015-11-11 NOTE — ED Notes (Signed)
Chervon from [employer] [phone]) called stating that the group home needs an updated FL2 form that has the correct dosage and frequency of depakote listed on it. She also requested if the patient could be put on IVC in order to have the patient transported by sheriff to the group home. Nurse stated that she would relay this information to the psychiatrist.

## 2015-11-11 NOTE — ED Notes (Signed)
Representative from [employer] guardianship called [phone] and asked if [facility]. Guardian relayed that [facility] by this afternoon at 1700, the patient would not be accepted at [facility]. Nurse relayed this information to social work.

## 2015-11-11 NOTE — ED Notes (Addendum)
Patient received all scheduled medications. Patient remains calm and cooperative. No signs of distress noted. Maintained on 15 minute checks and observation by security camera for safety.

## 2015-11-11 NOTE — ED Notes (Signed)
ED BHU PLACEMENT JUSTIFICATION Is the patient under IVC or is there intent for IVC: No. Is the patient medically cleared: Yes.   Is there vacancy in the ED BHU: Yes.   Is the population mix appropriate for patient: Yes.   Is the patient awaiting placement in inpatient or outpatient setting: Yes.   Has the patient had a psychiatric consult: Yes.   Survey of unit performed for contraband, proper placement and condition of furniture, tampering with fixtures in bathroom, shower, and each patient room: Yes.   APPEARANCE/BEHAVIOR calm, cooperative and adequate rapport can be established NEURO ASSESSMENT Orientation: time, place and person Hallucinations: yes Auditory Hallucinations Speech: Normal Gait: normal RESPIRATORY ASSESSMENT Normal expansion.  Clear to auscultation.  No rales, rhonchi, or wheezing. CARDIOVASCULAR ASSESSMENT regular rate and rhythm, S1, S2 normal, no murmur, click, rub or gallop GASTROINTESTINAL ASSESSMENT soft, nontender, BS WNL, no r/g EXTREMITIES normal strength, tone, and muscle mass PLAN OF CARE Provide calm/safe environment. Vital signs assessed twice daily. ED BHU Assessment once each 12-hour shift. Collaborate with intake RN daily or as condition indicates. Assure the ED provider has rounded once each shift. Provide and encourage hygiene. Provide redirection as needed. Assess for escalating behavior; address immediately and inform ED provider.  Assess family dynamic and appropriateness for visitation as needed: Yes.   Educate the patient/family about BHU procedures/visitation: Yes.

## 2015-11-11 NOTE — ED Notes (Signed)
Patient in dayroom resting.  No signs of distress noted at this time. Maintained on 15 minute checks and observation by security camera for safety.  

## 2015-11-11 NOTE — ED Notes (Signed)
Patient currently in dayroom watching television. No signs of acute distress noted at this time. Maintained on 15 minute checks and observation by security camera for safety.

## 2015-11-11 NOTE — Progress Notes (Signed)
Patient ID: [name] [name], male   DOB: 09-21-1985, 30 y.o.   MRN: [id number] CSW called the pt's prospective group home "[facility]" [place], [place] at ph: [phone] and spoke to the owner/operator who requested a correct FL-2 citing that the one sent previously by a social worker had incorrect medication instructions.  CSW complied, had FL-2 corrected and signed by [name] and resent.  Owner/operator then reported that this was a different FL-2 than [facility] and faxed over a different version and stated it would be okay if the CSW hand-wrote the new FL-2 and had [name] sign it., which Dr. Shary Keylapac's did.  CSW called and owner/operator accepted it then later said new FL-2 was "too messy" and requested that she (owner) type up a new FL-2 and email it to the CSW to be signed by [name] on Monday.  CSW replied that this would be fine.  During this exchange owner/operator was asked by the CSW [facility], the prearranged date of discharge and the owner/operator replied that "I'm not picking up anybody the other social worker said you all would IVC the pt and have them driven here by police".  CSW replied that this doesn't sound possible and the owner/operator said she had been told it was.  CSW said he would double-check but that "I am pretty sure that isn't done, but I will ask.  CSW called CSW Jetta LoutBailey Morgan who said this was not possible.  CSW was not able to reach owner/operator to inform her of this on 8/18. CSW recommends this be done on Saturday 8/19 to give owner/operator time to either make plans to transport patient or refuse so other plans can be made at this time.  See this CSW for group home recommendations in this area if needed.    [name] [name], LCSWA, LCAS  11/11/15

## 2015-11-11 NOTE — ED Notes (Signed)
ENVIRONMENTAL ASSESSMENT Potentially harmful objects out of patient reach: Yes Personal belongings secured: Yes Patient dressed in hospital provided attire only: Yes Plastic bags out of patient reach: Yes Patient care equipment (cords, cables, call bells, lines, and drains) shortened, removed, or accounted for: Yes Equipment and supplies removed from bottom of stretcher: Yes Potentially toxic materials out of patient reach: Yes Sharps container removed or out of patient reach: Yes  Patient is currently in room sleeping. No signs of distress noted. Maintained on 15 minute checks and observation by security camera for safety.  

## 2015-11-11 NOTE — ED Notes (Signed)
Patient remains asleep in no apparent distress. No issues to report this shift. Will continue to monitor with security cameras. Q15 minute rounds continue.

## 2015-11-11 NOTE — ED Provider Notes (Signed)
-----------------------------------------   7:06 AM on 11/11/2015 -----------------------------------------   Blood pressure 122/74, pulse (!) 101, temperature 98.2 F (36.8 C), temperature source Oral, resp. rate 16, height 5\' 10"  (1.778 m), weight 256 lb (116.1 kg), SpO2 98 %.  The patient had no acute events since last update.  Calm and cooperative at this time.  Disposition is pending per Psychiatry/Behavioral Medicine team recommendations.     Irean HongJade J Sung, MD 11/11/15 [phone]

## 2015-11-11 NOTE — [place] FL2 (Signed)
[place] MEDICA[place] FL2 LEVEL OF CARE SCREENING TOOL     IDENTIFICATION  Patient Name: Cristie HemMatthew Rashid Birthdate: Dec 21, 1985 Sex: male Admission Date (Current Location): 10/28/2015  [place]  [place] Access [id number] Stephannie PetersL [employer] Medicaid [id number] L Facility and Address:  [facility], [address], [place], [place] [address]      Provider Number: [id number]  Attending Physician Name and Address:  No att. providers found  Relative Name and Phone Number:  None    Current Level of Care: Hospital Recommended Level of Care: Other (Comment), Nursing Facility [facility]) Prior Approval Number:    Date Approved/Denied:   PASRR Number: [id number]  Discharge Plan:  [facility](Group[facility]Family Care Home)    Current Diagnoses: Patient Active Problem List   Diagnosis Date Noted  . Broken finger 06/09/2015  . Gastric reflux syndrome 04/05/2015  . Undifferentiated schizophrenia (HCC)   . Schizophrenia (HCC) 01/18/2015  . Aggressive behavior 11/25/2013  . 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    Orientation RESPIRATION BLADDER Height & Weight     Self, Time, Situation, Place  Normal Continent Weight: 256 lb (116.1 kg) Height:  5\' 10"  (177.8 cm)  BEHAVIORAL SYMPTOMS/MOOD NEUROLOGICAL BOWEL NUTRITION STATUS  Verbally abusive, Physically abusive, Dangerous to self, others or property   Continent Diet (Normal)  AMBULATORY STATUS COMMUNICATION OF NEEDS Skin   Independent Verbally Normal                       Personal Care Assistance Level of Assistance              Functional Limitations Info             SPECIAL CARE FACTORS FREQUENCY                       Contractures      Additional Factors Info  Code Status, Allergies Code Status Info: Full Code Allergies Info: Ibuprofen,Zoloft Sertraline HCL           Current Medications  (11/11/2015):  This is the current hospital active medication list Current Facility-Administered Medications  Medication Dose Route Frequency Provider Last Rate Last Dose  . acetaminophen (TYLENOL) tablet 500 mg  500 mg Oral Daily PRN Governor Rooksebecca Lord, MD   500 mg at 11/11/15 0903  . acetaminophen (TYLENOL) tablet 650 mg  650 mg Oral Q6H PRN Governor Rooksebecca Lord, MD   650 mg at 11/09/15 1824  . alum & mag hydroxide-simeth (MAALOX/MYLANTA) 200-200-20 MG/5ML suspension 30 mL  30 mL Oral Q6H PRN Willy EddyPatrick Robinson, MD   30 mL at 11/10/15 1516  . cholecalciferol (VITAMIN D) tablet 1,000 Units  1,000 Units Oral Daily Governor Rooksebecca Lord, MD   1,000 Units at 11/11/15 0902  . divalproex (DEPAKOTE) DR tablet 1,000 mg  1,000 mg Oral BID Governor Rooksebecca Lord, MD   1,000 mg at 11/11/15 [id number]  . haloperidol (HALDOL) tablet 5 mg  5 mg Oral TID Governor Rooksebecca Lord, MD   5 mg at 11/11/15 0902  . LORazepam (ATIVAN) tablet 1 mg  1 mg Oral TID Governor Rooksebecca Lord, MD   1 mg at 11/11/15 0902  . metoprolol tartrate (LOPRESSOR) tablet 12.5 mg  12.5 mg Oral Daily Sharman CheekPhillip Stafford, MD   12.5 mg at 11/11/15 0903  . mineral oil-hydrophilic petrolatum (AQUAPHOR) ointment   Topical BID PRN Governor Rooksebecca Lord, MD      . pantoprazole (PROTONIX) EC tablet 40 mg  40 mg Oral Daily Governor Rooksebecca Lord, MD   40 mg at 11/11/15 0902  . zolpidem (AMBIEN) tablet 5 mg  5 mg Oral QHS PRN Governor Rooksebecca Lord, MD   5 mg at 11/08/15 2100   Current Outpatient Prescriptions  Medication Sig Dispense Refill  . acetaminophen (TYLENOL) 500 MG tablet Take 1,000 mg by mouth every 6 (six) hours as needed for moderate pain.    . Cholecalciferol (VITAMIN D3) 10000 units TABS Take 1 tablet by mouth daily.    . divalproex (DEPAKOTE ER) 500 MG 24 hr tablet Take 1,000 mg by mouth 2 (two) times daily.    . haloperidol (HALDOL) 5 MG tablet Take 5 mg by mouth 3 (three) times daily.    Marland Kitchen. LORazepam (ATIVAN) 2 MG tablet Take 2 mg by mouth every 6 (six) hours as needed (for agitation).    . mineral oil-hydrophilic  petrolatum (AQUAPHOR) ointment Apply 1 application topically 2 (two) times daily as needed for dry skin.    . pantoprazole (PROTONIX) 40 MG tablet Take 1 tablet (40 mg total) by mouth daily. 30 tablet 0  . zolpidem (AMBIEN) 5 MG tablet Take 5 mg by mouth at bedtime.    Marland Kitchen. acetaminophen (TYLENOL) 500 MG tablet Take 1 tablet (500 mg total) by mouth daily as needed (pain). 60 tablet 1  . cholecalciferol 1000 units tablet Take 1 tablet (1,000 Units total) by mouth daily. 30 tablet 1  . cyclobenzaprine (FLEXERIL) 10 MG tablet Take 10 mg by mouth 3 (three) times daily as needed for muscle spasms.    . divalproex (DEPAKOTE) 500 MG DR tablet Take 2 tablets (1,000 mg total) by mouth 2 (two) times daily. 120 tablet 1  . haloperidol (HALDOL) 5 MG tablet Take 1 tablet (5 mg total) by mouth 3 (three) times daily. 90 tablet 1  . LORazepam (ATIVAN) 2 MG tablet Take 1 tablet (2 mg total) by mouth 3 (three) times daily. 90 tablet 1  . mineral oil-hydrophilic petrolatum (AQUAPHOR) ointment Apply topically 2 (two) times daily as needed for dry skin. 420 g 1  . paliperidone (INVEGA SUSTENNA) 234 MG/1.5ML SUSP injection Inject 234 mg into the muscle every 28 (twenty-eight) days. To be given on the 22nd day of the month 0.9 mL 1  . pantoprazole (PROTONIX) 40 MG tablet Take 1 tablet (40 mg total) by mouth daily. 30 tablet 1  . zolpidem (AMBIEN) 5 MG tablet Take 1 tablet (5 mg total) by mouth at bedtime as needed for sleep. 30 tablet 1     Discharge Medications: Please see discharge summary for a list of discharge medications.  Relevant Imaging Results:  Relevant Lab Results:   Additional Information Pt has gastric reflux symtoms  Dorothe PeaJonathan F Ariell Gunnels, LCSWA

## 2015-11-11 NOTE — ED Notes (Signed)
Report was received from Karena T., RN; Pt. Verbalizes no complaints or distress; denies S.I./Hi. Continue to monitor with 15 min. Monitoring. 

## 2015-11-11 NOTE — ED Notes (Signed)
Patient states that; he is hearing voices; the voice is that of a girl; high pitched; and is talking bad about him; and that; the invega shot is wearing off.

## 2015-11-11 NOTE — ED Notes (Signed)
Patient currently in dayroom resting. Patient appears to be less anxious and restless at this time . Will continue to monitor patient for anxiety. Maintained on 15 minute checks and observation by security camera for safety.

## 2015-11-11 NOTE — ED Notes (Signed)
Patient currently in dayroom watching television. No signs of distress noted. Maintained on 15 minute checks and observation by security camera for safety.

## 2015-11-11 NOTE — Consult Note (Signed)
  Psychiatry: Brief follow-up. Patient has no new complaints. No new behavior problems. Discharge is apparently now held up by further paperwork difficulties. Patient is frustrated but has not been acting out and is generally taking it okay. No change to medication plan.

## 2015-11-11 NOTE — ED Notes (Signed)
Patient asleep in room. No noted distress or abnormal behavior. Will continue 15 minute checks and observation by security cameras for safety. 

## 2015-11-12 DIAGNOSIS — F39 Unspecified mood [affective] disorder: Secondary | ICD-10-CM | POA: Diagnosis not present

## 2015-11-12 NOTE — ED Notes (Signed)
Patient in dayroom, watching television. Social with other peer. Maintained on 15 minute checks and observation by security camera for safety.

## 2015-11-12 NOTE — ED Notes (Signed)
Patient took a shower. Maintained on 15 minute checks and observation by security camera for safety. 

## 2015-11-12 NOTE — ED Notes (Signed)
PT VOLUNTARY PENDING PLACEMENT. 

## 2015-11-12 NOTE — ED Notes (Signed)
Report was received from Amy H., RN; Pt. Verbalizes no complaints or distress; denies S.I./Hi. Continue to monitor with 15 min. Monitoring. 

## 2015-11-12 NOTE — ED Notes (Signed)
Patient received meal tray. Maintained on 15 minute checks and observation by security camera for safety.

## 2015-11-12 NOTE — ED Notes (Signed)
Patient received meal tray and beverage. 

## 2015-11-12 NOTE — ED Provider Notes (Signed)
-----------------------------------------   7:26 AM on 11/12/2015 -----------------------------------------   Blood pressure (!) 137/91, pulse 99, temperature 98.1 F (36.7 C), temperature source Oral, resp. rate 18, height 5\' 10"  (1.778 m), weight 256 lb (116.1 kg), SpO2 97 %.  The patient had no acute events since last update.  Calm and cooperative at this time.  The patient does not meet inpatient criteria and he cannot return to the group home. He is awaiting placement by Child psychotherapistsocial worker.     Rebecka ApleyAllison P Webster, MD 11/12/15 [phone]

## 2015-11-12 NOTE — ED Notes (Signed)
Patient received meal tray 

## 2015-11-12 NOTE — ED Notes (Signed)

## 2015-11-12 NOTE — ED Notes (Signed)
Patient watching television. Maintained on 15 minute checks and observation by security camera for safety.

## 2015-11-12 NOTE — ED Notes (Signed)
Patient resting quietly in room. No noted distress or abnormal behaviors noted. Will continue 15 minute checks and observation by security camera for safety. 

## 2015-11-12 NOTE — ED Notes (Signed)
Maintained on 15 minute checks and observation by security camera for safety. 

## 2015-11-12 NOTE — ED Notes (Signed)
Patient is calm and cooperative. In no acute distress. Maintained on 15 minute checks and observation by security camera for safety.

## 2015-11-12 NOTE — ED Notes (Signed)
Patient walking around the unit. Remains in good behavioral control. Maintained on 15 minute checks and observation by security camera for safety.

## 2015-11-13 DIAGNOSIS — F39 Unspecified mood [affective] disorder: Secondary | ICD-10-CM | POA: Diagnosis not present

## 2015-11-13 NOTE — ED Notes (Signed)
Patient resting quietly in room. No noted distress or abnormal behaviors noted. Will continue 15 minute checks and observation by security camera for safety. 

## 2015-11-13 NOTE — ED Provider Notes (Signed)
-----------------------------------------   6:42 AM on 11/13/2015 -----------------------------------------   Blood pressure 123/76, pulse 98, temperature 98.1 F (36.7 C), temperature source Oral, resp. rate 18, height 5\' 10"  (1.778 m), weight 256 lb (116.1 kg), SpO2 95 %.  The patient had no acute events since last update.  Calm and cooperative at this time.  Disposition is pending per Psychiatry/Behavioral Medicine team recommendations.     Irean HongJade J Ilene Witcher, MD 11/13/15 [phone]

## 2015-11-13 NOTE — ED Notes (Signed)
Patient given meal tray and beverage. 

## 2015-11-13 NOTE — ED Notes (Signed)
Maintained on 15 minute checks and observation by security camera for safety. 

## 2015-11-13 NOTE — ED Notes (Signed)
Patient awake, alert, and oriented. Cooperative with all nursing interventions. Appetite good at breakfast. Offers no complaints. Maintained on 15 minute checks and observation by security camera for safety.

## 2015-11-13 NOTE — ED Notes (Signed)
Patient received meal tray and beverage. 

## 2015-11-13 NOTE — ED Notes (Signed)
Patient in dayroom, social with peers. No complaints. Maintained on 15 minute checks and observation by security camera for safety.

## 2015-11-13 NOTE — ED Notes (Signed)
Report was received from Amy H., RN; Pt. Verbalizes no complaints or distress; denies S.I./Hi. Per patient, "I hope that I'm leaving tomorrow." Continue to monitor with 15 min. Monitoring.

## 2015-11-13 NOTE — ED Notes (Signed)

## 2015-11-13 NOTE — ED Notes (Signed)

## 2015-11-13 NOTE — ED Notes (Signed)
Patient asleep in room. No noted distress or abnormal behavior. Will continue 15 minute checks and observation by security cameras for safety. 

## 2015-11-13 NOTE — ED Notes (Signed)
Patient made several phone calls to family. Maintained on 15 minute checks and observation by security camera for safety.

## 2015-11-14 DIAGNOSIS — F39 Unspecified mood [affective] disorder: Secondary | ICD-10-CM | POA: Diagnosis not present

## 2015-11-14 NOTE — ED Notes (Signed)
Patient currently in room speaking on phone. No signs of distress noted. Will continue to monitor. Maintained on 15 minute checks and observation by security camera for safety.

## 2015-11-14 NOTE — ED Notes (Signed)
Patient in dayroom resting and interacting with peers. No signs of distress noted at this time. Maintained on 15 minute checks and observation by security camera for safety.

## 2015-11-14 NOTE — ED Notes (Signed)
Patient came and asked the nurse if there were any updates for his discharge. Patient said that he had gotten increasingly anxious and frustrated. Nurse spoke with patient and provided reassurance, patient voiced that he felt more calm at this time. No signs of distress noted. Maintained on 15 minute checks and observation by security camera for safety.

## 2015-11-14 NOTE — ED Notes (Signed)
Patient denies SI/HI/AVH and pain. Patient is calm and cooperative at this time. Continues to ask if he will be discharged today, nurse informed patient that discharge was still pending.  Maintained on 15 minute checks and observation by security camera for safety.

## 2015-11-14 NOTE — Progress Notes (Signed)
Patient ID: [name] HemMatthew [name], male   DOB: Dec 01, 1985, 30 y.o.   MRN: [id number] CSW called the pt's Care Coordinator Shelby DubinChervonne Thompson at [phone] at [facility] facilitate the authorization needed by the pt's group home, [name] at ph: [phone].  [name] is the group home Production designer, theatre/television/filmmanager.  CSW first called at 9:48am and again at 12:15 pm and then called [name]'s supervisor [name] at ph: [phone] and left a message.  This authorization must be completed and be able to seen by [facility] in "theri system" before the pt will be accepted. Once this authorization has been completed transportation will need to be arranged and [name], the pt's legal guardian of [employer] [facility] [place] at ph: [phone] who is attempting to convince [facility] to pay for transportation to the pt's group home.  [facility] has informed the CSW this is unlikely, but [employer] says that [name] is facilitating this.  CSW is also exploring options with [name] in IVC'ing the pt to assist in providing transportation for the pt. CSW has sent the group home a handwritten FL-2 in order to meet a 5pm.   [name], LCSWA, LCAS

## 2015-11-14 NOTE — ED Notes (Signed)
Patient in dayroom watching television. No signs of acute distress noted at this time. Maintained on 15 minute checks and observation by security camera for safety.  

## 2015-11-14 NOTE — ED Notes (Signed)
Patient currently in dayroom resting. No signs of distress noted. Maintained on 15 minute checks and observation by security camera for safety.

## 2015-11-14 NOTE — ED Notes (Signed)

## 2015-11-14 NOTE — ED Provider Notes (Signed)
-----------------------------------------   6:42 AM on 11/14/2015 -----------------------------------------   Blood pressure 115/67, pulse 95, temperature 98.4 F (36.9 C), temperature source Oral, resp. rate 18, height 5\' 10"  (1.778 m), weight 256 lb (116.1 kg), SpO2 98 %.  The patient had no acute events since last update.  Calm and cooperative at this time.    Patient awaiting placement     Rebecka ApleyAllison P Sade Hollon, MD 11/14/15 [phone]

## 2015-11-14 NOTE — ED Notes (Signed)
Patient in dayroom watching television. No signs of distress noted. Maintained on 15 minute checks and observation by security camera for safety.  

## 2015-11-14 NOTE — ED Notes (Signed)
Patient in room resting. Patient took all scheduled medication.  No signs of distress noted. Maintained on 15 minute checks and observation by security camera for safety.

## 2015-11-14 NOTE — ED Notes (Signed)
ENVIRONMENTAL ASSESSMENT Potentially harmful objects out of patient reach: Yes Personal belongings secured: Yes Patient dressed in hospital provided attire only: Yes Plastic bags out of patient reach: Yes Patient care equipment (cords, cables, call bells, lines, and drains) shortened, removed, or accounted for: Yes Equipment and supplies removed from bottom of stretcher: Yes Potentially toxic materials out of patient reach: Yes Sharps container removed or out of patient reach: Yes  Patient currently in room sleeping. No signs of distress noted. Maintained on 15 minute checks and observation by security camera for safety.  

## 2015-11-14 NOTE — ED Notes (Signed)
Patient resting quietly in day room. No noted distress or abnormal behaviors noted. Will continue 15 minute checks and observation by security camera for safety. 

## 2015-11-14 NOTE — ED Notes (Signed)
Patient in room resting. No signs of distress noted. Maintained on 15 minute checks and observation by security camera for safety.  

## 2015-11-14 NOTE — ED Notes (Signed)
Patient asleep in room. No noted distress or abnormal behavior. Will continue 15 minute checks and observation by security cameras for safety. 

## 2015-11-15 DIAGNOSIS — F39 Unspecified mood [affective] disorder: Secondary | ICD-10-CM | POA: Diagnosis not present

## 2015-11-15 MED ORDER — LORAZEPAM 2 MG PO TABS
ORAL_TABLET | ORAL | Status: AC
  Administered 2015-11-15: 2 mg via ORAL
  Filled 2015-11-15: qty 1

## 2015-11-15 MED ORDER — PALIPERIDONE PALMITATE 234 MG/1.5ML IM SUSP
234.0000 mg | Freq: Once | INTRAMUSCULAR | Status: AC
  Administered 2015-11-15: 234 mg via INTRAMUSCULAR
  Filled 2015-11-15: qty 1.5

## 2015-11-15 MED ORDER — LORAZEPAM 2 MG PO TABS
2.0000 mg | ORAL_TABLET | Freq: Once | ORAL | Status: AC
  Administered 2015-11-15: 2 mg via ORAL

## 2015-11-15 NOTE — Progress Notes (Signed)
CSW spoke with Ms. Archer AsaMcCullough [phone], [facility] manager. Ms. Archer AsaMcCullough states that she received the updated FL2 that CSW faxed earlier this morning and it is correct. Ms. Meredeth IdeMcCullogh is now waiting on [facility] to finish drafting a contract. She states pt may be ready for d/c tomorrow. Ms. Archer AsaMcCullough is also requesting that pt be sent with a 2 week supply of meds. MD and RN notified.   Jonathon JordanLynn B Faline Langer, MSW, Theresia MajorsLCSWA [phone]

## 2015-11-15 NOTE — ED Notes (Signed)
Patient in room watching television. No signs of distress noted. Maintained on 15 minute checks and observation by security camera for safety.

## 2015-11-15 NOTE — ED Notes (Signed)
Patient took a shower and is currently resting in room. No signs of acute distress noted at this time. Maintained on 15 minute checks and observation by security camera for safety.

## 2015-11-15 NOTE — ED Provider Notes (Signed)
Vitals:   11/14/15 2046 11/15/15 0633  BP: 125/90 120/71  Pulse: 94 92  Resp: 19 18  Temp: 97.7 F (36.5 C) 97.8 F (36.6 C)   Labs Reviewed  COMPREHENSIVE METABOLIC PANEL - Abnormal; Notable for the following:       Result Value   Glucose, Bld 119 (*)    Creatinine, Ser 0.59 (*)    AST 47 (*)    ALT 73 (*)    All other components within normal limits  ACETAMINOPHEN LEVEL - Abnormal; Notable for the following:    Acetaminophen (Tylenol), Serum <10 (*)    All other components within normal limits  URINE DRUG SCREEN, QUALITATIVE ([facility] ONLY) - Abnormal; Notable for the following:    Tricyclic, Ur Screen POSITIVE (*)    Benzodiazepine, Ur Scrn POSITIVE (*)    All other components within normal limits  VALPROIC ACID LEVEL - Abnormal; Notable for the following:    Valproic Acid Lvl 28 (*)    All other components within normal limits  COMPREHENSIVE METABOLIC PANEL - Abnormal; Notable for the following:    Glucose, Bld 133 (*)    AST 59 (*)    ALT 102 (*)    All other components within normal limits  VALPROIC ACID LEVEL - Abnormal; Notable for the following:    Valproic Acid Lvl 115 (*)    All other components within normal limits  HEPATIC FUNCTION PANEL - Abnormal; Notable for the following:    AST 58 (*)    ALT 108 (*)    Bilirubin, Direct <0.1 (*)    All other components within normal limits  ETHANOL  SALICYLATE LEVEL  CBC  CBC WITH DIFFERENTIAL/PLATELET  TROPONIN I  FIBRIN DERIVATIVES D-DIMER ([facility] ONLY)  TSH   Patient remains medically stable for psychiatric placement.   Emily FilbertJonathan E Michah Minton, MD 11/15/15 0700

## 2015-11-15 NOTE — ED Notes (Signed)
Patient in room resting. Patient received all scheduled medication. No signs of distress noted. Maintained on 15 minute checks and observation by security camera for safety.

## 2015-11-15 NOTE — ED Notes (Signed)
Per social work, group home requested that the patient be sent with 2 week supply of medication. Nurse called pharmacy and asked if a 2 week supply of medicine would be possible, and the pharmacy representative stated that mostly only a 7 day supply is provided by the hospital. Nurse relayed this information to social work.

## 2015-11-15 NOTE — ED Notes (Signed)

## 2015-11-15 NOTE — ED Notes (Signed)
Patient in dayroom watching television. No signs of acute distress noted at this time. Maintained on 15 minute checks and observation by security camera for safety.

## 2015-11-15 NOTE — Consult Note (Signed)
  Psychiatry: Follow-up for this 30 year old man with schizoaffective disorder and developmental disability. Patient has been here in the emergency room for several weeks now. He has no new complaints. Behavior is calm and appropriate. Once again we continue to run into what seem like an analyst number of paperwork and bureaucratic holdup's to getting him released from the emergency room. Patient is being remarkably patient.

## 2015-11-15 NOTE — ED Notes (Signed)
Patient resting quietly in room. No noted distress or abnormal behaviors noted. Will continue 15 minute checks and observation by security camera for safety. 

## 2015-11-15 NOTE — ED Notes (Signed)
Patient denies SI/HI/AVh and endorses pain rated a 7/10 in his left foot. patient remains calm and cooperative this morning. Patient administered tylenol for his foot pain. Maintained on 15 minute checks and observation by security camera for safety.

## 2015-11-15 NOTE — ED Notes (Signed)
Patient came to nurse's station stating that he wanted to talk with someone. Nurse met with patient who said he was beginning to feel agitated and frustrated because he had not left the hospital yet. Patient stated that he was mad and had thoughts of hitting security officers or other staff members. He said that he also had thoughts of punching the wall and thatt instead of acting on these things he asked for help. Patient also admitted that he considered saying he was suciidal and homicidal so that he could be admitted to a hospital. Patient then confirmed that he indeed was not suicidal or homicidal. Per physician order, 2 mg of ativan administered to patient without incident. Nurse talked more with patient, explained why he had not left the hospital, and also discussed coping strategies with patient. Patient soon began more calm and began to laugh and smile. Will continue to monitor patient for signs of increased agitation. Maintained on 15 minute checks and observation by security camera for safety.

## 2015-11-15 NOTE — ED Notes (Signed)
ENVIRONMENTAL ASSESSMENT Potentially harmful objects out of patient reach: Yes Personal belongings secured: Yes Patient dressed in hospital provided attire only: Yes Plastic bags out of patient reach: Yes Patient care equipment (cords, cables, call bells, lines, and drains) shortened, removed, or accounted for: Yes Equipment and supplies removed from bottom of stretcher: Yes Potentially toxic materials out of patient reach: Yes Sharps container removed or out of patient reach: Yes  Patient currently in room sleeping. No signs of distress noted at this time. Maintained on 15 minute checks and observation by security camera for safety.  

## 2015-11-15 NOTE — ED Notes (Signed)
Pt. Alert and oriented, warm and dry, in no distress. Pt. Denies SI, HI, and AVH. Pt. Encouraged to let nursing staff know of any concerns or needs. 

## 2015-11-15 NOTE — ED Notes (Signed)
Patient received Invega Injection at this time without problem. Patient remained calm and cooperative, no signs of acute distress noted. Maintained on 15 minute checks and observation by security camera for safety.

## 2015-11-15 NOTE — ED Notes (Signed)
Patient currently in room eating lunch. No signs of distress noted. Maintained on 15 minute checks and observation by security camera for safety.

## 2015-11-16 DIAGNOSIS — F39 Unspecified mood [affective] disorder: Secondary | ICD-10-CM | POA: Diagnosis not present

## 2015-11-16 NOTE — ED Notes (Signed)
VOL/Poss placement on 11/17/15

## 2015-11-16 NOTE — ED Notes (Signed)
Pt reports he got some urine on his underwear when he went to the bathroom. Requested new underwear and same given. Soiled underwear placed in trash.

## 2015-11-16 NOTE — ED Notes (Signed)
Patient resting quietly in room. No noted distress or abnormal behaviors noted. Will continue 15 minute checks and observation by security camera for safety. 

## 2015-11-16 NOTE — ED Notes (Signed)
Patient quiet and cooperative.  Maintained on 15 minute checks and observation by security camera for safety.

## 2015-11-16 NOTE — ED Notes (Signed)
Patient received meal tray and beverage. 

## 2015-11-16 NOTE — ED Notes (Signed)
Pt. Alert and oriented, warm and dry, in no distress. Pt. Denies SI, HI, and AVH. Pt states he had some anxiety earlier in day thinking [place]. [place], "[place] is so far away for my family." This Clinical research associatewriter encouraged patient to think of the positives like [place], and it will be new people, so it will be like turning a new leaf. Advised patient that at new group home to make sure he tries to talk out his frustrations when he gets upset and not act out. Patient states he would really try. Pt. Encouraged to let nursing staff know of any concerns or needs.

## 2015-11-16 NOTE — ED Notes (Signed)
Patient in dayroom, social with peers.Maintained on 15 minute checks and observation by security camera for safety.

## 2015-11-16 NOTE — ED Provider Notes (Signed)
-----------------------------------------   7:18 AM on 11/16/2015 -----------------------------------------   Blood pressure 124/74, pulse 95, temperature 98.4 F (36.9 C), temperature source Oral, resp. rate 18, height 5\' 10"  (1.778 m), weight 256 lb (116.1 kg), SpO2 99 %.  The patient had no acute events since last update.  Calm and cooperative at this time.  Disposition is pending per Psychiatry/Behavioral Medicine team recommendations.     Irean HongJade J Alec Jaros, MD 11/16/15 [phone]

## 2015-11-16 NOTE — ED Notes (Signed)
Patient resting quietly in room. No noted distress or abnormal behaviors noted. Will continue 15 minute checks and observation by security camera for safety. Patient received breakfast tray.

## 2015-11-16 NOTE — Progress Notes (Signed)
CSW called Chervon pt's [facility] care coordinator [phone]. Chervon states that d/c was delayed [facility] for pt instead of the originally agreed upon 3 month contract. Chervon states that the correct paperwork is being drafted and she is hopeful for d/c tomorrow.   Jonathon JordanLynn B Clarie Camey, MSW, Theresia MajorsLCSWA [phone]

## 2015-11-16 NOTE — ED Notes (Signed)
Patient asking questions about new group home. Maintained on 15 minute checks and observation by security camera for safety.

## 2015-11-16 NOTE — ED Notes (Signed)
Maintained on 15 minute checks and observation by security camera for safety. 

## 2015-11-16 NOTE — ED Notes (Signed)
Received phone call from group home manager. She states that she cannot accept patient today because authorization is "not in the system." Will alert Child psychotherapistsocial worker.

## 2015-11-16 NOTE — ED Notes (Signed)
Patient watching television in dayroom. Maintained on 15 minute checks and observation by security camera for safety.

## 2015-11-16 NOTE — ED Notes (Signed)
Patient awake and alert. Cooperative with nursing interventions. Patient took a shower. Disposition still in progress per SW. Maintained on 15 minute checks and observation by security camera for safety.

## 2015-11-16 NOTE — ED Notes (Signed)

## 2015-11-16 NOTE — ED Notes (Signed)
ED BHU PLACEMENT JUSTIFICATION Is the patient under IVC or is there intent for IVC: No. Is the patient medically cleared: Yes.   Is there vacancy in the ED BHU: Yes.   Is the population mix appropriate for patient: Yes.   Is the patient awaiting placement in inpatient or outpatient setting: yes Has the patient had a psychiatric consult: Yes.   Survey of unit performed for contraband, proper placement and condition of furniture, tampering with fixtures in bathroom, shower, and each patient room: Yes.  ; Findings: NA APPEARANCE/BEHAVIOR calm, cooperative and adequate rapport can be established NEURO ASSESSMENT Orientation: time, place and person Hallucinations: No.None noted (Hallucinations) Speech: Normal Gait: normal RESPIRATORY ASSESSMENT Normal expansion.  Clear to auscultation.  No rales, rhonchi, or wheezing. CARDIOVASCULAR ASSESSMENT regular rate and rhythm, S1, S2 normal, no murmur, click, rub or gallop GASTROINTESTINAL ASSESSMENT soft, nontender, BS WNL, no r/g EXTREMITIES normal strength, tone, and muscle mass PLAN OF CARE Provide calm/safe environment. Vital signs assessed twice daily. ED BHU Assessment once each 12-hour shift. Collaborate with intake RN daily or as condition indicates. Assure the ED provider has rounded once each shift. Provide and encourage hygiene. Provide redirection as needed. Assess for escalating behavior; address immediately and inform ED provider.  Assess family dynamic and appropriateness for visitation as needed: Yes.  ; If necessary, describe findings: NA Educate the patient/family about BHU procedures/visitation: Yes.  ; If necessary, describe findings: NA

## 2015-11-16 NOTE — ED Notes (Signed)

## 2015-11-16 NOTE — ED Notes (Signed)
No significant change in clinical status.  Maintained on 15 minute checks and observation by security camera for safety.

## 2015-11-17 DIAGNOSIS — F39 Unspecified mood [affective] disorder: Secondary | ICD-10-CM | POA: Diagnosis not present

## 2015-11-17 NOTE — ED Notes (Signed)
Patient in dayroom watching television. No signs of acute distress noted. Maintained on 15 minute checks and observation by security camera for safety.  

## 2015-11-17 NOTE — ED Notes (Signed)
Patient currently in dayroom socializing with peers. No signs of distress noted. Maintained on 15 minute checks and observation by security camera for safety.

## 2015-11-17 NOTE — ED Notes (Signed)
Patient currently in dayroom socializing with peers. No signs of acute distress noted. Maintained on 15 minute checks and observation by security camera for safety.

## 2015-11-17 NOTE — Consult Note (Signed)
  Follow-up note for this patient was schizoaffective disorder and developmental disability. No new complaints. No behavior problems. Met with another representative from [employer] today. Most recent update is that we are promised he will be able to be discharged tomorrow. I have refiled commitment papers in order to facilitate a transportation to his new group home. No other change to treatment plan for today.

## 2015-11-17 NOTE — ED Notes (Signed)
Patient denies SI/HI/AVH and endorses pain rated a 6/10 in his left foot, which was treated by tylenol 500 mg. Patient says that he feels sleepy this morning but otherwise feels good and less anxious. Patient is calm and cooperative. No signs of distress noted. Maintained on 15 minute checks and observation by security camera for safety.

## 2015-11-17 NOTE — ED Notes (Signed)
Patient noted in day room. No complaints, stable, in no acute distress. Q15 minute rounds and monitoring via Security Cameras to continue. 

## 2015-11-17 NOTE — ED Notes (Signed)
IVC/Pending Placement 

## 2015-11-17 NOTE — ED Provider Notes (Signed)
-----------------------------------------   7:26 AM on 11/17/2015 -----------------------------------------   Blood pressure 134/75, pulse 95, temperature 98.1 F (36.7 C), temperature source Oral, resp. rate 16, height 5\' 10"  (1.778 m), weight 256 lb (116.1 kg), SpO2 98 %.  The patient had no acute events since last update.  Calm and cooperative at this time.  Social workers currently working with the facility, they're hopeful for disposition today.   Minna AntisKevin Yedidya Duddy, MD 11/17/15 [phone]

## 2015-11-17 NOTE — ED Notes (Signed)
Patient currently in dayroom resting. No signs of distress noted. Maintained on 15 minute checks and observation by security camera for safety.  

## 2015-11-17 NOTE — ED Notes (Signed)
Patient relaxing in dayroom. No signs of distress noted. Maintained on 15 minute checks and observation by security camera for safety.

## 2015-11-17 NOTE — ED Notes (Signed)
ENVIRONMENTAL ASSESSMENT Potentially harmful objects out of patient reach: Yes Personal belongings secured: Yes Patient dressed in hospital provided attire only: Yes Plastic bags out of patient reach: Yes Patient care equipment (cords, cables, call bells, lines, and drains) shortened, removed, or accounted for: Yes Equipment and supplies removed from bottom of stretcher: Yes Potentially toxic materials out of patient reach: Yes Sharps container removed or out of patient reach: Yes  Patient is currently in room sleeping. No signs of distress noted. Maintained on 15 minute checks and observation by security camera for safety.  

## 2015-11-17 NOTE — ED Notes (Signed)
Patient came to nurse and stated that he was starting to feel anxious after speaking with is aunt on the phone. Patient said that he was worried that his family would not be able to come and visit him often in the new group home. Nurse provided reassurance and provided patient with magazines for entertainment. Patient is calm and cooperative at this time. Maintained on 15 minute checks and observation by security camera for safety.

## 2015-11-17 NOTE — Progress Notes (Addendum)
[facility] care coordinator, Dorothea Oglehervonne, came to meet with pt, psychiatrist, and CSW today. Chervonne dropped off pt's medications and [facility] is satisfied with receiving him with the meds that Chervonne provided. EDP signed pt's Person Centered Plan for his group home and transportation has been arranged. Everything seems to be in place and Chervonne has promised d/c for 8/25. CSW informed pt of above and he is agreeable with the plan.  Legal guardian, Waldron SessionVonda, [phone] was also notified via confidential voicemail of above plan.  Jonathon JordanLynn B Adriell Polansky, MSW, Theresia MajorsLCSWA [phone]

## 2015-11-17 NOTE — ED Notes (Signed)
Patient asleep in room. No noted distress or abnormal behavior. Will continue 15 minute checks and observation by security cameras for safety. 

## 2015-11-17 NOTE — ED Notes (Signed)
[facility] came to deliver patient a two week supply of medication. Nurse inventoried medication and sent bags for storage in pharmacy.

## 2015-11-17 NOTE — ED Notes (Signed)
Patient currently in the shower. No signs if distress noted. Maintained on 15 minute checks and observation by security camera for safety.

## 2015-11-17 NOTE — ED Notes (Signed)
Patient resting quietly in room. No noted distress or abnormal behaviors noted. Will continue 15 minute checks and observation by security camera for safety. 

## 2015-11-17 NOTE — ED Notes (Signed)
Patient in room resting. No signs of distress noted. Maintained on 15 minute checks and observation by security camera for safety.  

## 2015-11-18 DIAGNOSIS — F39 Unspecified mood [affective] disorder: Secondary | ICD-10-CM | POA: Diagnosis not present

## 2015-11-18 NOTE — Progress Notes (Signed)
LCSW received a call from patients guardian requesting if she could give my number to [facility] and to discuss option with previous [facility] who wants to deliver personal effects-  32 " tv- game boy system and 10 large bags of clothing. In consultation with ED nurses [facility] and they DO NOT deliver furniture or that amount of personal effects. [facility] Colleen from [facility] [phone] was informed.    Called New group [facility] Ms Mylo Red[facility]ullham she [facility], once received she will call this worker and we will make transportation arrangements.  LCSW called and left a detailed message with patients guardian Hazle CocaVonda Thomas and left  Message and awaiting  call back   Renae FickleCalled [facility] [phone] requesting a call back and left her a detailed message.  Delta Air LinesClaudine Brantly Kalman LCSW [phone]

## 2015-11-18 NOTE — Progress Notes (Signed)
LCSW was called by [facility] Chervonne and she reported the [facility] and [facility] is to be [facility]  [address], [place],  [phone] .  LCSW spoke to [place]onda -Guardian and she will  Work on patients personal effects be delivered.  Consulted with ED Secretary and gave her the address and information needed for transporting this patient safely.

## 2015-11-18 NOTE — ED Notes (Signed)
Patient is currently in dayroom watching television. No signs of acute distress noted. Maintained on 15 minute checks and observation by security camera for safety.

## 2015-11-18 NOTE — ED Notes (Signed)
Patient currently in shower. No signs of distress noted. Maintained on 15 minute checks and observation by security camera for safety.

## 2015-11-18 NOTE — ED Notes (Signed)
Patient asleep in room. No noted distress or abnormal behavior. Will continue 15 minute checks and observation by security cameras for safety. 

## 2015-11-18 NOTE — ED Notes (Signed)
Patient denies SI/HI/AVH and pain. All belongings returned to patient, medications given to sheriff for transport. Discharge instructions reviewed with patient and patient counseled by staff about anger management. Patient transported to group home via sheriff.

## 2015-11-18 NOTE — ED Notes (Signed)
Patient not slated to discharge at this time. Departure condition and vitals below entered in error.

## 2015-11-18 NOTE — ED Notes (Signed)
Pt is alert and oriented this evening and in good spirits. Pt states that he is looking forward to his new group home and feels like he has learned a lot about managing his anger going forward. Pt denies SI/HI and AVH as well. Writer discussed tx plan and 15 minute checks are ongoing for safety.

## 2015-11-18 NOTE — Progress Notes (Signed)
LCSW spoke to EDP and he will do the discharge note.  Delta Air LinesClaudine Lucillie Kiesel LCSW [phone]

## 2015-11-18 NOTE — ED Provider Notes (Signed)
-----------------------------------------   7:37 AM on 11/18/2015 -----------------------------------------   Blood pressure 119/66, pulse 93, temperature 98.2 F (36.8 C), temperature source Oral, resp. rate 18, height 5\' 10"  (1.778 m), weight 256 lb (116.1 kg), SpO2 97 %.  The patient had no acute events since last update.  Calm and cooperative at this time.  Disposition is pending per Psychiatry/CSW team placement to group home; hopefully today.     Irean HongJade J Sung, MD 11/18/15 [phone]

## 2015-11-18 NOTE — ED Notes (Signed)
ENVIRONMENTAL ASSESSMENT Potentially harmful objects out of patient reach: Yes Personal belongings secured: Yes Patient dressed in hospital provided attire only: Yes Plastic bags out of patient reach: Yes Patient care equipment (cords, cables, call bells, lines, and drains) shortened, removed, or accounted for: Yes Equipment and supplies removed from bottom of stretcher: Yes Potentially toxic materials out of patient reach: Yes Sharps container removed or out of patient reach: Yes  Patient is currently in room sleeping. No signs of distress noted. Maintained on 15 minute checks and observation by security camera for safety.  

## 2015-11-18 NOTE — ED Notes (Signed)
Patient in dayroom resting. No signs of distress noted. Maintained on 15 minute checks and observation by security camera for safety.  

## 2015-11-18 NOTE — ED Notes (Signed)
Patient currently in room eating lunch. No signs of acute distress noted. Maintained on 15 minute checks and observation by security camera for safety.

## 2015-11-18 NOTE — ED Provider Notes (Signed)
-----------------------------------------   1:34 PM on 11/18/2015 -----------------------------------------   Blood pressure (!) 141/83, pulse 86, temperature 97.5 F (36.4 C), temperature source Oral, resp. rate 18, height 5\' 10"  (1.778 m), weight 256 lb (116.1 kg), SpO2 100 %.  The patient had no acute events since last update.  Calm and cooperative at this time.  Patient has been accepted to a psychiatric facility. To be discharged under her IVC.    Myrna Blazeravid Kail Shjon Lizarraga, MD 11/18/15 [phone]
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
